# Patient Record
Sex: Female | Born: 1985 | Race: White | Marital: Single | Smoking: Former smoker
Health system: Southern US, Community
[De-identification: ages and names within clinical notes are randomized; demographics above are authoritative.]

## PROBLEM LIST (undated history)

## (undated) DIAGNOSIS — F331 Major depressive disorder, recurrent, moderate: Secondary | ICD-10-CM

## (undated) DIAGNOSIS — L853 Xerosis cutis: Secondary | ICD-10-CM

## (undated) DIAGNOSIS — I950 Idiopathic hypotension: Secondary | ICD-10-CM

## (undated) DIAGNOSIS — A6920 Lyme disease, unspecified: Secondary | ICD-10-CM

## (undated) DIAGNOSIS — K638219 Small intestinal bacterial overgrowth, unspecified: Secondary | ICD-10-CM

## (undated) DIAGNOSIS — N943 Premenstrual tension syndrome: Secondary | ICD-10-CM

## (undated) DIAGNOSIS — M4126 Other idiopathic scoliosis, lumbar region: Secondary | ICD-10-CM

## (undated) DIAGNOSIS — F99 Mental disorder, not otherwise specified: Secondary | ICD-10-CM

## (undated) DIAGNOSIS — G43009 Migraine without aura, not intractable, without status migrainosus: Secondary | ICD-10-CM

## (undated) DIAGNOSIS — K6389 Other specified diseases of intestine: Secondary | ICD-10-CM

## (undated) DIAGNOSIS — A6923 Arthritis due to Lyme disease: Secondary | ICD-10-CM

## (undated) DIAGNOSIS — Z91018 Allergy to other foods: Secondary | ICD-10-CM

## (undated) DIAGNOSIS — K219 Gastro-esophageal reflux disease without esophagitis: Secondary | ICD-10-CM

## (undated) DIAGNOSIS — Z8541 Personal history of malignant neoplasm of cervix uteri: Secondary | ICD-10-CM

## (undated) DIAGNOSIS — F5105 Insomnia due to other mental disorder: Secondary | ICD-10-CM

## (undated) HISTORY — DX: Migraine without aura, not intractable, without status migrainosus: G43.009

## (undated) HISTORY — PX: GANGLION CYST EXCISION: SHX1691

## (undated) HISTORY — DX: Idiopathic hypotension: I95.0

## (undated) HISTORY — DX: Allergy to other foods: Z91.018

## (undated) HISTORY — DX: Xerosis cutis: L85.3

## (undated) HISTORY — DX: Other idiopathic scoliosis, lumbar region: M41.26

## (undated) HISTORY — DX: Premenstrual tension syndrome: N94.3

## (undated) HISTORY — DX: Lyme disease, unspecified: A69.20

## (undated) HISTORY — DX: Insomnia due to other mental disorder: F51.05

## (undated) HISTORY — DX: Small intestinal bacterial overgrowth, unspecified: K63.8219

## (undated) HISTORY — DX: Gastro-esophageal reflux disease without esophagitis: K21.9

## (undated) HISTORY — DX: Arthritis due to Lyme disease: A69.23

## (undated) HISTORY — DX: Major depressive disorder, recurrent, moderate: F33.1

## (undated) HISTORY — DX: Personal history of malignant neoplasm of cervix uteri: Z85.41

## (undated) HISTORY — DX: Other specified diseases of intestine: K63.89

## (undated) HISTORY — DX: Insomnia due to other mental disorder: F99

---

## 2007-10-10 HISTORY — PX: TONSILLECTOMY: SUR1361

## 2009-08-18 HISTORY — PX: BREAST ENHANCEMENT SURGERY: SHX7

## 2011-09-20 HISTORY — PX: SINOSCOPY: SHX187

## 2015-05-31 ENCOUNTER — Other Ambulatory Visit: Payer: Self-pay | Admitting: Neurosurgery

## 2015-05-31 DIAGNOSIS — M5416 Radiculopathy, lumbar region: Secondary | ICD-10-CM

## 2015-06-08 ENCOUNTER — Ambulatory Visit
Admission: RE | Admit: 2015-06-08 | Discharge: 2015-06-08 | Disposition: A | Discharge status: Home / Self Care | Payer: BLUE CROSS/BLUE SHIELD | Source: Ambulatory Visit | Attending: Neurosurgery | Admitting: Neurosurgery

## 2015-06-08 DIAGNOSIS — M5416 Radiculopathy, lumbar region: Secondary | ICD-10-CM

## 2015-06-08 MED ORDER — IOHEXOL 180 MG/ML  SOLN
1.0000 mL | Freq: Once | INTRAMUSCULAR | Status: DC | PRN
  Administered 2015-06-08: 1 mL via EPIDURAL

## 2015-06-08 MED ORDER — IOHEXOL 180 MG/ML  SOLN: 1.0000 mL | Freq: Once | INTRAMUSCULAR | Status: DC | PRN

## 2015-06-08 MED ORDER — METHYLPREDNISOLONE ACETATE 40 MG/ML INJ SUSP (RADIOLOG
120.0000 mg | Freq: Once | INTRAMUSCULAR | Status: AC
  Administered 2015-06-08: 120 mg via EPIDURAL

## 2015-06-08 NOTE — Discharge Instructions (Signed)

## 2015-08-05 ENCOUNTER — Other Ambulatory Visit: Payer: Self-pay | Admitting: Neurosurgery

## 2015-08-05 DIAGNOSIS — M5416 Radiculopathy, lumbar region: Secondary | ICD-10-CM

## 2015-08-10 ENCOUNTER — Ambulatory Visit
Admission: RE | Admit: 2015-08-10 | Discharge: 2015-08-10 | Disposition: A | Discharge status: Home / Self Care | Payer: BLUE CROSS/BLUE SHIELD | Source: Ambulatory Visit | Attending: Neurosurgery | Admitting: Neurosurgery

## 2015-08-10 DIAGNOSIS — M5416 Radiculopathy, lumbar region: Secondary | ICD-10-CM

## 2015-08-10 IMAGING — XA DG EPIDUROGRAM S+I
1 series · 2 of 2 positions shown · non-contrast
Comparison: none

CLINICAL DATA: Lumbosacral spondylosis without myelopathy.

[Series 1: ortho standard · 2 of 2 slices shown]
[im 1/2]
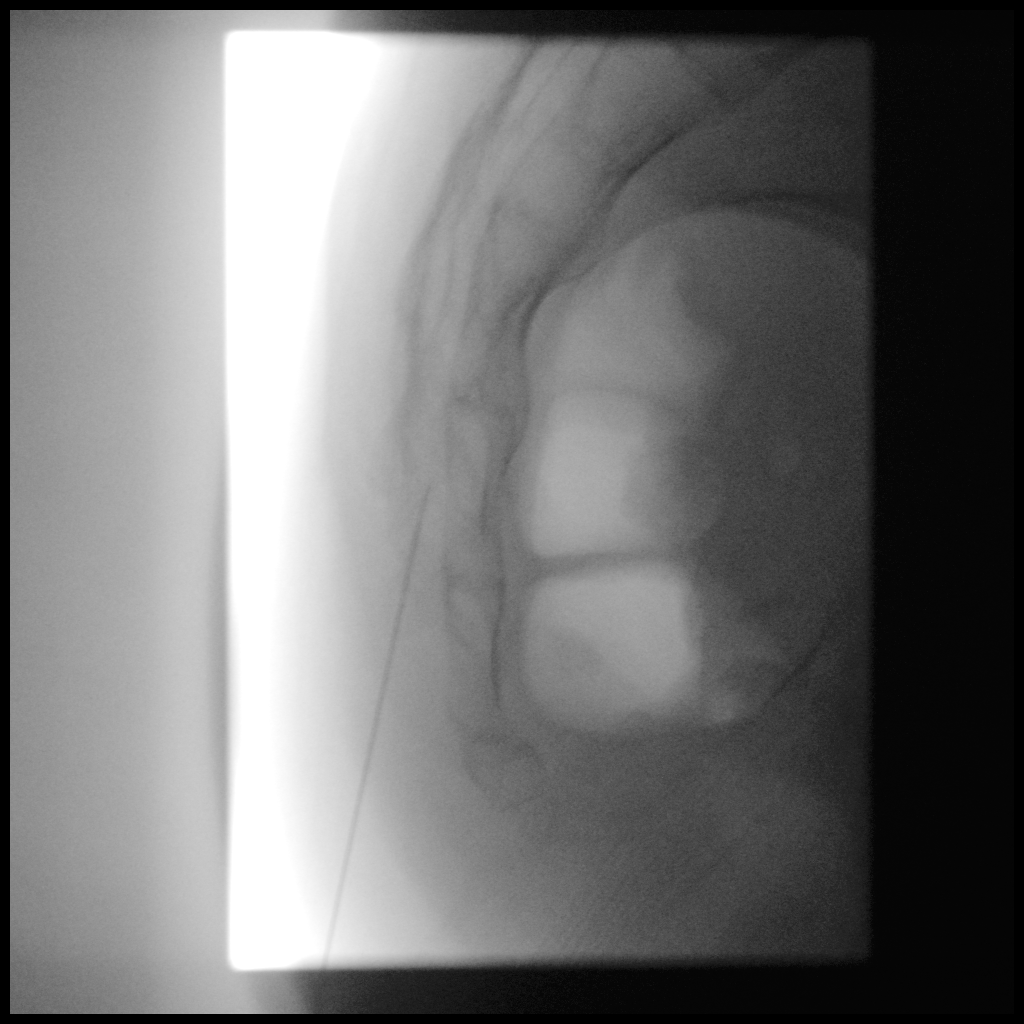
[im 2/2]
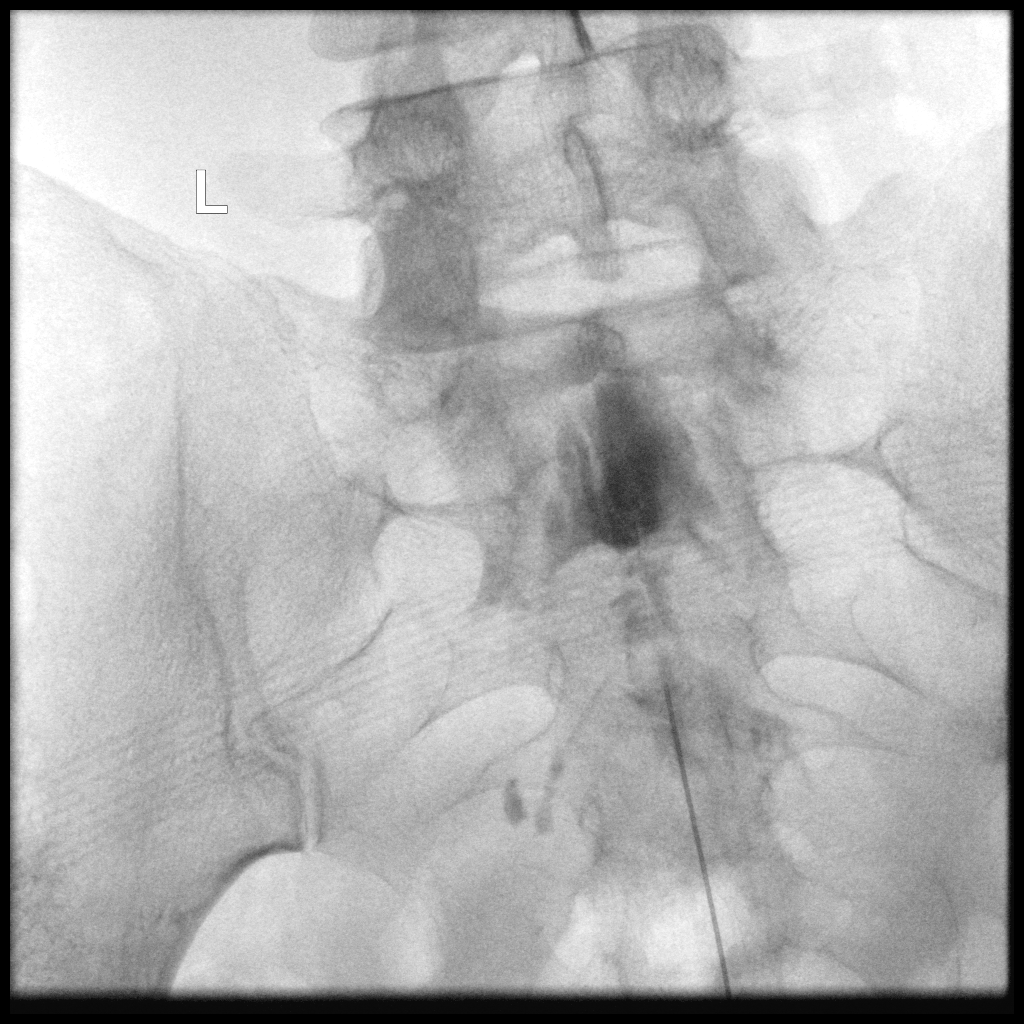

[2 of 2 positions shown; findings below may reference images not displayed]

FLUOROSCOPY TIME:  17 seconds corresponding to a Dose Area Product
of 24.55 ?Gy*m2

EXAM:
CAUDAL EPIDURAL INJECTION

Utilizing a caudal approach, the skin overlying the sacral hiatus
was cleansed and anesthetized. A 22 Crawford epidural needle was
advanced into the sacral epidural space. Injection of Omnipaque 180
shows a good epidural pattern with spread up to L5-S1. No vascular
opacification is seen.

120 mg of Depo-Medrol mixed with 3 ml of normal saline and 3 ml of
1% Lidocaine were instilled. The procedure was well-tolerated, and
the patient was discharged thirty minutes following the injection in
good condition.
IMPRESSION: Technically successful caudal epidural injection #2.

## 2015-08-10 MED ORDER — IOHEXOL 180 MG/ML  SOLN
1.0000 mL | Freq: Once | INTRAMUSCULAR | Status: DC | PRN
  Administered 2015-08-10: 1 mL via EPIDURAL

## 2015-08-10 MED ORDER — METHYLPREDNISOLONE ACETATE 40 MG/ML INJ SUSP (RADIOLOG
120.0000 mg | Freq: Once | INTRAMUSCULAR | Status: AC
  Administered 2015-08-10: 120 mg via EPIDURAL

## 2016-03-01 DIAGNOSIS — R197 Diarrhea, unspecified: Secondary | ICD-10-CM | POA: Diagnosis not present

## 2016-03-01 DIAGNOSIS — R109 Unspecified abdominal pain: Secondary | ICD-10-CM | POA: Diagnosis not present

## 2016-03-01 DIAGNOSIS — A049 Bacterial intestinal infection, unspecified: Secondary | ICD-10-CM | POA: Diagnosis not present

## 2016-03-01 DIAGNOSIS — R112 Nausea with vomiting, unspecified: Secondary | ICD-10-CM | POA: Diagnosis not present

## 2016-03-01 DIAGNOSIS — K529 Noninfective gastroenteritis and colitis, unspecified: Secondary | ICD-10-CM | POA: Diagnosis not present

## 2016-03-08 DIAGNOSIS — A6920 Lyme disease, unspecified: Secondary | ICD-10-CM | POA: Diagnosis not present

## 2016-03-08 DIAGNOSIS — R1110 Vomiting, unspecified: Secondary | ICD-10-CM | POA: Diagnosis not present

## 2016-03-08 DIAGNOSIS — E2749 Other adrenocortical insufficiency: Secondary | ICD-10-CM | POA: Diagnosis not present

## 2016-03-10 DIAGNOSIS — Z9049 Acquired absence of other specified parts of digestive tract: Secondary | ICD-10-CM | POA: Diagnosis not present

## 2016-03-10 DIAGNOSIS — R112 Nausea with vomiting, unspecified: Secondary | ICD-10-CM | POA: Diagnosis not present

## 2016-03-10 DIAGNOSIS — K529 Noninfective gastroenteritis and colitis, unspecified: Secondary | ICD-10-CM | POA: Diagnosis not present

## 2016-04-13 DIAGNOSIS — M545 Low back pain: Secondary | ICD-10-CM | POA: Diagnosis not present

## 2016-04-13 DIAGNOSIS — A6923 Arthritis due to Lyme disease: Secondary | ICD-10-CM | POA: Diagnosis not present

## 2016-04-13 DIAGNOSIS — M2550 Pain in unspecified joint: Secondary | ICD-10-CM | POA: Diagnosis not present

## 2016-05-11 DIAGNOSIS — L811 Chloasma: Secondary | ICD-10-CM | POA: Diagnosis not present

## 2016-05-16 DIAGNOSIS — R1310 Dysphagia, unspecified: Secondary | ICD-10-CM | POA: Diagnosis not present

## 2016-05-16 DIAGNOSIS — R1084 Generalized abdominal pain: Secondary | ICD-10-CM | POA: Diagnosis not present

## 2016-05-16 DIAGNOSIS — K6389 Other specified diseases of intestine: Secondary | ICD-10-CM | POA: Diagnosis not present

## 2016-05-25 DIAGNOSIS — K222 Esophageal obstruction: Secondary | ICD-10-CM | POA: Diagnosis not present

## 2016-05-25 DIAGNOSIS — R1310 Dysphagia, unspecified: Secondary | ICD-10-CM | POA: Diagnosis not present

## 2016-06-13 DIAGNOSIS — E2749 Other adrenocortical insufficiency: Secondary | ICD-10-CM | POA: Diagnosis not present

## 2016-06-13 DIAGNOSIS — A6920 Lyme disease, unspecified: Secondary | ICD-10-CM | POA: Diagnosis not present

## 2016-07-17 DIAGNOSIS — Z23 Encounter for immunization: Secondary | ICD-10-CM | POA: Diagnosis not present

## 2016-07-17 DIAGNOSIS — M5489 Other dorsalgia: Secondary | ICD-10-CM | POA: Diagnosis not present

## 2016-07-17 DIAGNOSIS — F321 Major depressive disorder, single episode, moderate: Secondary | ICD-10-CM | POA: Diagnosis not present

## 2016-07-17 DIAGNOSIS — A6923 Arthritis due to Lyme disease: Secondary | ICD-10-CM | POA: Diagnosis not present

## 2016-07-17 DIAGNOSIS — F5101 Primary insomnia: Secondary | ICD-10-CM | POA: Diagnosis not present

## 2016-07-25 DIAGNOSIS — M545 Low back pain: Secondary | ICD-10-CM | POA: Diagnosis not present

## 2016-07-25 DIAGNOSIS — R2689 Other abnormalities of gait and mobility: Secondary | ICD-10-CM | POA: Diagnosis not present

## 2016-07-25 DIAGNOSIS — M546 Pain in thoracic spine: Secondary | ICD-10-CM | POA: Diagnosis not present

## 2016-07-25 DIAGNOSIS — M6281 Muscle weakness (generalized): Secondary | ICD-10-CM | POA: Diagnosis not present

## 2016-07-28 DIAGNOSIS — M6281 Muscle weakness (generalized): Secondary | ICD-10-CM | POA: Diagnosis not present

## 2016-07-28 DIAGNOSIS — M546 Pain in thoracic spine: Secondary | ICD-10-CM | POA: Diagnosis not present

## 2016-07-28 DIAGNOSIS — R2689 Other abnormalities of gait and mobility: Secondary | ICD-10-CM | POA: Diagnosis not present

## 2016-07-28 DIAGNOSIS — M545 Low back pain: Secondary | ICD-10-CM | POA: Diagnosis not present

## 2016-08-01 DIAGNOSIS — R2689 Other abnormalities of gait and mobility: Secondary | ICD-10-CM | POA: Diagnosis not present

## 2016-08-01 DIAGNOSIS — M545 Low back pain: Secondary | ICD-10-CM | POA: Diagnosis not present

## 2016-08-01 DIAGNOSIS — M546 Pain in thoracic spine: Secondary | ICD-10-CM | POA: Diagnosis not present

## 2016-08-01 DIAGNOSIS — M6281 Muscle weakness (generalized): Secondary | ICD-10-CM | POA: Diagnosis not present

## 2016-08-03 DIAGNOSIS — R2689 Other abnormalities of gait and mobility: Secondary | ICD-10-CM | POA: Diagnosis not present

## 2016-08-03 DIAGNOSIS — M546 Pain in thoracic spine: Secondary | ICD-10-CM | POA: Diagnosis not present

## 2016-08-03 DIAGNOSIS — M545 Low back pain: Secondary | ICD-10-CM | POA: Diagnosis not present

## 2016-08-03 DIAGNOSIS — M6281 Muscle weakness (generalized): Secondary | ICD-10-CM | POA: Diagnosis not present

## 2016-08-04 DIAGNOSIS — K6389 Other specified diseases of intestine: Secondary | ICD-10-CM | POA: Diagnosis not present

## 2016-08-04 DIAGNOSIS — K5909 Other constipation: Secondary | ICD-10-CM | POA: Diagnosis not present

## 2016-08-04 DIAGNOSIS — R198 Other specified symptoms and signs involving the digestive system and abdomen: Secondary | ICD-10-CM | POA: Diagnosis not present

## 2016-08-08 DIAGNOSIS — M546 Pain in thoracic spine: Secondary | ICD-10-CM | POA: Diagnosis not present

## 2016-08-08 DIAGNOSIS — M545 Low back pain: Secondary | ICD-10-CM | POA: Diagnosis not present

## 2016-08-08 DIAGNOSIS — R2689 Other abnormalities of gait and mobility: Secondary | ICD-10-CM | POA: Diagnosis not present

## 2016-08-08 DIAGNOSIS — M6281 Muscle weakness (generalized): Secondary | ICD-10-CM | POA: Diagnosis not present

## 2016-08-10 DIAGNOSIS — R2689 Other abnormalities of gait and mobility: Secondary | ICD-10-CM | POA: Diagnosis not present

## 2016-08-10 DIAGNOSIS — M6281 Muscle weakness (generalized): Secondary | ICD-10-CM | POA: Diagnosis not present

## 2016-08-10 DIAGNOSIS — M545 Low back pain: Secondary | ICD-10-CM | POA: Diagnosis not present

## 2016-08-10 DIAGNOSIS — M546 Pain in thoracic spine: Secondary | ICD-10-CM | POA: Diagnosis not present

## 2016-08-21 DIAGNOSIS — J209 Acute bronchitis, unspecified: Secondary | ICD-10-CM | POA: Diagnosis not present

## 2016-08-21 DIAGNOSIS — J208 Acute bronchitis due to other specified organisms: Secondary | ICD-10-CM | POA: Diagnosis not present

## 2016-08-23 DIAGNOSIS — F321 Major depressive disorder, single episode, moderate: Secondary | ICD-10-CM | POA: Diagnosis not present

## 2016-08-23 DIAGNOSIS — M5489 Other dorsalgia: Secondary | ICD-10-CM | POA: Diagnosis not present

## 2016-08-23 DIAGNOSIS — F5101 Primary insomnia: Secondary | ICD-10-CM | POA: Diagnosis not present

## 2016-08-23 DIAGNOSIS — A6923 Arthritis due to Lyme disease: Secondary | ICD-10-CM | POA: Diagnosis not present

## 2016-09-07 DIAGNOSIS — R112 Nausea with vomiting, unspecified: Secondary | ICD-10-CM | POA: Diagnosis not present

## 2016-09-07 DIAGNOSIS — R197 Diarrhea, unspecified: Secondary | ICD-10-CM | POA: Diagnosis not present

## 2016-09-07 DIAGNOSIS — R1010 Upper abdominal pain, unspecified: Secondary | ICD-10-CM | POA: Diagnosis not present

## 2016-09-08 DIAGNOSIS — K529 Noninfective gastroenteritis and colitis, unspecified: Secondary | ICD-10-CM | POA: Diagnosis not present

## 2016-09-12 DIAGNOSIS — M62838 Other muscle spasm: Secondary | ICD-10-CM | POA: Diagnosis not present

## 2016-09-12 DIAGNOSIS — A6920 Lyme disease, unspecified: Secondary | ICD-10-CM | POA: Diagnosis not present

## 2016-09-12 DIAGNOSIS — M999 Biomechanical lesion, unspecified: Secondary | ICD-10-CM | POA: Diagnosis not present

## 2016-09-14 DIAGNOSIS — R109 Unspecified abdominal pain: Secondary | ICD-10-CM | POA: Diagnosis not present

## 2016-09-14 DIAGNOSIS — R1110 Vomiting, unspecified: Secondary | ICD-10-CM | POA: Diagnosis not present

## 2016-09-14 DIAGNOSIS — R1010 Upper abdominal pain, unspecified: Secondary | ICD-10-CM | POA: Diagnosis not present

## 2016-09-14 DIAGNOSIS — R197 Diarrhea, unspecified: Secondary | ICD-10-CM | POA: Diagnosis not present

## 2016-09-18 DIAGNOSIS — H04123 Dry eye syndrome of bilateral lacrimal glands: Secondary | ICD-10-CM | POA: Diagnosis not present

## 2016-09-27 DIAGNOSIS — R1010 Upper abdominal pain, unspecified: Secondary | ICD-10-CM | POA: Diagnosis not present

## 2016-09-27 DIAGNOSIS — R112 Nausea with vomiting, unspecified: Secondary | ICD-10-CM | POA: Diagnosis not present

## 2016-09-29 DIAGNOSIS — R109 Unspecified abdominal pain: Secondary | ICD-10-CM | POA: Diagnosis not present

## 2016-09-29 DIAGNOSIS — R1011 Right upper quadrant pain: Secondary | ICD-10-CM | POA: Diagnosis not present

## 2016-10-09 HISTORY — PX: CHOLECYSTECTOMY: SHX55

## 2016-10-16 DIAGNOSIS — M546 Pain in thoracic spine: Secondary | ICD-10-CM | POA: Diagnosis not present

## 2016-10-16 DIAGNOSIS — M6281 Muscle weakness (generalized): Secondary | ICD-10-CM | POA: Diagnosis not present

## 2016-10-16 DIAGNOSIS — R2689 Other abnormalities of gait and mobility: Secondary | ICD-10-CM | POA: Diagnosis not present

## 2016-10-16 DIAGNOSIS — M545 Low back pain: Secondary | ICD-10-CM | POA: Diagnosis not present

## 2016-10-25 DIAGNOSIS — R112 Nausea with vomiting, unspecified: Secondary | ICD-10-CM | POA: Diagnosis not present

## 2016-10-25 DIAGNOSIS — R197 Diarrhea, unspecified: Secondary | ICD-10-CM | POA: Diagnosis not present

## 2016-10-25 DIAGNOSIS — R1084 Generalized abdominal pain: Secondary | ICD-10-CM | POA: Diagnosis not present

## 2016-10-25 DIAGNOSIS — R109 Unspecified abdominal pain: Secondary | ICD-10-CM | POA: Diagnosis not present

## 2016-10-30 DIAGNOSIS — R29898 Other symptoms and signs involving the musculoskeletal system: Secondary | ICD-10-CM | POA: Diagnosis not present

## 2016-10-30 DIAGNOSIS — M25561 Pain in right knee: Secondary | ICD-10-CM | POA: Diagnosis not present

## 2016-10-31 DIAGNOSIS — Z01419 Encounter for gynecological examination (general) (routine) without abnormal findings: Secondary | ICD-10-CM | POA: Diagnosis not present

## 2016-11-02 DIAGNOSIS — J018 Other acute sinusitis: Secondary | ICD-10-CM | POA: Diagnosis not present

## 2016-11-15 DIAGNOSIS — R112 Nausea with vomiting, unspecified: Secondary | ICD-10-CM | POA: Diagnosis not present

## 2016-11-15 DIAGNOSIS — R197 Diarrhea, unspecified: Secondary | ICD-10-CM | POA: Diagnosis not present

## 2016-11-15 DIAGNOSIS — R1013 Epigastric pain: Secondary | ICD-10-CM | POA: Diagnosis not present

## 2016-11-20 DIAGNOSIS — M545 Low back pain: Secondary | ICD-10-CM | POA: Diagnosis not present

## 2016-12-04 DIAGNOSIS — M545 Low back pain: Secondary | ICD-10-CM | POA: Diagnosis not present

## 2016-12-04 DIAGNOSIS — R2689 Other abnormalities of gait and mobility: Secondary | ICD-10-CM | POA: Diagnosis not present

## 2016-12-04 DIAGNOSIS — M6281 Muscle weakness (generalized): Secondary | ICD-10-CM | POA: Diagnosis not present

## 2016-12-04 DIAGNOSIS — M546 Pain in thoracic spine: Secondary | ICD-10-CM | POA: Diagnosis not present

## 2016-12-06 DIAGNOSIS — J018 Other acute sinusitis: Secondary | ICD-10-CM | POA: Diagnosis not present

## 2016-12-11 DIAGNOSIS — M6281 Muscle weakness (generalized): Secondary | ICD-10-CM | POA: Diagnosis not present

## 2016-12-11 DIAGNOSIS — R2689 Other abnormalities of gait and mobility: Secondary | ICD-10-CM | POA: Diagnosis not present

## 2016-12-11 DIAGNOSIS — M545 Low back pain: Secondary | ICD-10-CM | POA: Diagnosis not present

## 2016-12-11 DIAGNOSIS — M546 Pain in thoracic spine: Secondary | ICD-10-CM | POA: Diagnosis not present

## 2016-12-15 DIAGNOSIS — R2689 Other abnormalities of gait and mobility: Secondary | ICD-10-CM | POA: Diagnosis not present

## 2016-12-15 DIAGNOSIS — M546 Pain in thoracic spine: Secondary | ICD-10-CM | POA: Diagnosis not present

## 2016-12-15 DIAGNOSIS — M545 Low back pain: Secondary | ICD-10-CM | POA: Diagnosis not present

## 2016-12-15 DIAGNOSIS — M6281 Muscle weakness (generalized): Secondary | ICD-10-CM | POA: Diagnosis not present

## 2016-12-20 DIAGNOSIS — F321 Major depressive disorder, single episode, moderate: Secondary | ICD-10-CM | POA: Diagnosis not present

## 2016-12-20 DIAGNOSIS — R2689 Other abnormalities of gait and mobility: Secondary | ICD-10-CM | POA: Diagnosis not present

## 2016-12-20 DIAGNOSIS — F5101 Primary insomnia: Secondary | ICD-10-CM | POA: Diagnosis not present

## 2016-12-20 DIAGNOSIS — A6923 Arthritis due to Lyme disease: Secondary | ICD-10-CM | POA: Diagnosis not present

## 2016-12-20 DIAGNOSIS — M5489 Other dorsalgia: Secondary | ICD-10-CM | POA: Diagnosis not present

## 2016-12-20 DIAGNOSIS — M6281 Muscle weakness (generalized): Secondary | ICD-10-CM | POA: Diagnosis not present

## 2016-12-20 DIAGNOSIS — M546 Pain in thoracic spine: Secondary | ICD-10-CM | POA: Diagnosis not present

## 2016-12-20 DIAGNOSIS — M545 Low back pain: Secondary | ICD-10-CM | POA: Diagnosis not present

## 2016-12-21 DIAGNOSIS — M545 Low back pain: Secondary | ICD-10-CM | POA: Diagnosis not present

## 2016-12-21 DIAGNOSIS — R2689 Other abnormalities of gait and mobility: Secondary | ICD-10-CM | POA: Diagnosis not present

## 2016-12-21 DIAGNOSIS — M6281 Muscle weakness (generalized): Secondary | ICD-10-CM | POA: Diagnosis not present

## 2016-12-21 DIAGNOSIS — M546 Pain in thoracic spine: Secondary | ICD-10-CM | POA: Diagnosis not present

## 2016-12-25 DIAGNOSIS — R2689 Other abnormalities of gait and mobility: Secondary | ICD-10-CM | POA: Diagnosis not present

## 2016-12-25 DIAGNOSIS — M6281 Muscle weakness (generalized): Secondary | ICD-10-CM | POA: Diagnosis not present

## 2016-12-25 DIAGNOSIS — M546 Pain in thoracic spine: Secondary | ICD-10-CM | POA: Diagnosis not present

## 2016-12-25 DIAGNOSIS — M545 Low back pain: Secondary | ICD-10-CM | POA: Diagnosis not present

## 2016-12-26 DIAGNOSIS — J018 Other acute sinusitis: Secondary | ICD-10-CM | POA: Diagnosis not present

## 2016-12-28 DIAGNOSIS — K5900 Constipation, unspecified: Secondary | ICD-10-CM | POA: Diagnosis not present

## 2017-01-03 DIAGNOSIS — M545 Low back pain: Secondary | ICD-10-CM | POA: Diagnosis not present

## 2017-01-03 DIAGNOSIS — M546 Pain in thoracic spine: Secondary | ICD-10-CM | POA: Diagnosis not present

## 2017-01-03 DIAGNOSIS — M6281 Muscle weakness (generalized): Secondary | ICD-10-CM | POA: Diagnosis not present

## 2017-01-03 DIAGNOSIS — R2689 Other abnormalities of gait and mobility: Secondary | ICD-10-CM | POA: Diagnosis not present

## 2017-01-08 DIAGNOSIS — M546 Pain in thoracic spine: Secondary | ICD-10-CM | POA: Diagnosis not present

## 2017-01-08 DIAGNOSIS — M6281 Muscle weakness (generalized): Secondary | ICD-10-CM | POA: Diagnosis not present

## 2017-01-08 DIAGNOSIS — R2689 Other abnormalities of gait and mobility: Secondary | ICD-10-CM | POA: Diagnosis not present

## 2017-01-08 DIAGNOSIS — M545 Low back pain: Secondary | ICD-10-CM | POA: Diagnosis not present

## 2017-01-10 DIAGNOSIS — M546 Pain in thoracic spine: Secondary | ICD-10-CM | POA: Diagnosis not present

## 2017-01-10 DIAGNOSIS — M545 Low back pain: Secondary | ICD-10-CM | POA: Diagnosis not present

## 2017-01-10 DIAGNOSIS — R2689 Other abnormalities of gait and mobility: Secondary | ICD-10-CM | POA: Diagnosis not present

## 2017-01-10 DIAGNOSIS — M6281 Muscle weakness (generalized): Secondary | ICD-10-CM | POA: Diagnosis not present

## 2017-01-15 DIAGNOSIS — R2689 Other abnormalities of gait and mobility: Secondary | ICD-10-CM | POA: Diagnosis not present

## 2017-01-15 DIAGNOSIS — M6281 Muscle weakness (generalized): Secondary | ICD-10-CM | POA: Diagnosis not present

## 2017-01-15 DIAGNOSIS — M545 Low back pain: Secondary | ICD-10-CM | POA: Diagnosis not present

## 2017-01-15 DIAGNOSIS — M546 Pain in thoracic spine: Secondary | ICD-10-CM | POA: Diagnosis not present

## 2017-01-17 DIAGNOSIS — M545 Low back pain: Secondary | ICD-10-CM | POA: Diagnosis not present

## 2017-01-17 DIAGNOSIS — M546 Pain in thoracic spine: Secondary | ICD-10-CM | POA: Diagnosis not present

## 2017-01-17 DIAGNOSIS — R2689 Other abnormalities of gait and mobility: Secondary | ICD-10-CM | POA: Diagnosis not present

## 2017-01-17 DIAGNOSIS — M6281 Muscle weakness (generalized): Secondary | ICD-10-CM | POA: Diagnosis not present

## 2017-01-22 DIAGNOSIS — M545 Low back pain: Secondary | ICD-10-CM | POA: Diagnosis not present

## 2017-01-22 DIAGNOSIS — M6281 Muscle weakness (generalized): Secondary | ICD-10-CM | POA: Diagnosis not present

## 2017-01-22 DIAGNOSIS — R2689 Other abnormalities of gait and mobility: Secondary | ICD-10-CM | POA: Diagnosis not present

## 2017-01-22 DIAGNOSIS — M546 Pain in thoracic spine: Secondary | ICD-10-CM | POA: Diagnosis not present

## 2017-01-26 DIAGNOSIS — F331 Major depressive disorder, recurrent, moderate: Secondary | ICD-10-CM | POA: Diagnosis not present

## 2017-01-26 DIAGNOSIS — M791 Myalgia: Secondary | ICD-10-CM | POA: Diagnosis not present

## 2017-01-29 DIAGNOSIS — M546 Pain in thoracic spine: Secondary | ICD-10-CM | POA: Diagnosis not present

## 2017-01-29 DIAGNOSIS — R2689 Other abnormalities of gait and mobility: Secondary | ICD-10-CM | POA: Diagnosis not present

## 2017-01-29 DIAGNOSIS — M545 Low back pain: Secondary | ICD-10-CM | POA: Diagnosis not present

## 2017-01-29 DIAGNOSIS — M6281 Muscle weakness (generalized): Secondary | ICD-10-CM | POA: Diagnosis not present

## 2017-01-30 DIAGNOSIS — I950 Idiopathic hypotension: Secondary | ICD-10-CM | POA: Diagnosis not present

## 2017-01-30 DIAGNOSIS — R5383 Other fatigue: Secondary | ICD-10-CM | POA: Diagnosis not present

## 2017-01-30 DIAGNOSIS — M546 Pain in thoracic spine: Secondary | ICD-10-CM | POA: Diagnosis not present

## 2017-01-31 DIAGNOSIS — M546 Pain in thoracic spine: Secondary | ICD-10-CM | POA: Diagnosis not present

## 2017-01-31 DIAGNOSIS — M6281 Muscle weakness (generalized): Secondary | ICD-10-CM | POA: Diagnosis not present

## 2017-01-31 DIAGNOSIS — M545 Low back pain: Secondary | ICD-10-CM | POA: Diagnosis not present

## 2017-01-31 DIAGNOSIS — R2689 Other abnormalities of gait and mobility: Secondary | ICD-10-CM | POA: Diagnosis not present

## 2017-02-05 DIAGNOSIS — M545 Low back pain: Secondary | ICD-10-CM | POA: Diagnosis not present

## 2017-02-05 DIAGNOSIS — M546 Pain in thoracic spine: Secondary | ICD-10-CM | POA: Diagnosis not present

## 2017-02-05 DIAGNOSIS — R2689 Other abnormalities of gait and mobility: Secondary | ICD-10-CM | POA: Diagnosis not present

## 2017-02-05 DIAGNOSIS — M6281 Muscle weakness (generalized): Secondary | ICD-10-CM | POA: Diagnosis not present

## 2017-02-07 DIAGNOSIS — M25552 Pain in left hip: Secondary | ICD-10-CM | POA: Diagnosis not present

## 2017-02-07 DIAGNOSIS — M25551 Pain in right hip: Secondary | ICD-10-CM | POA: Diagnosis not present

## 2017-02-07 DIAGNOSIS — F320 Major depressive disorder, single episode, mild: Secondary | ICD-10-CM | POA: Diagnosis not present

## 2017-02-07 DIAGNOSIS — M545 Low back pain: Secondary | ICD-10-CM | POA: Diagnosis not present

## 2017-02-14 DIAGNOSIS — M546 Pain in thoracic spine: Secondary | ICD-10-CM | POA: Diagnosis not present

## 2017-02-14 DIAGNOSIS — M545 Low back pain: Secondary | ICD-10-CM | POA: Diagnosis not present

## 2017-02-14 DIAGNOSIS — M6281 Muscle weakness (generalized): Secondary | ICD-10-CM | POA: Diagnosis not present

## 2017-02-14 DIAGNOSIS — R2689 Other abnormalities of gait and mobility: Secondary | ICD-10-CM | POA: Diagnosis not present

## 2017-02-19 DIAGNOSIS — M6281 Muscle weakness (generalized): Secondary | ICD-10-CM | POA: Diagnosis not present

## 2017-02-19 DIAGNOSIS — M546 Pain in thoracic spine: Secondary | ICD-10-CM | POA: Diagnosis not present

## 2017-02-19 DIAGNOSIS — R2689 Other abnormalities of gait and mobility: Secondary | ICD-10-CM | POA: Diagnosis not present

## 2017-02-19 DIAGNOSIS — M545 Low back pain: Secondary | ICD-10-CM | POA: Diagnosis not present

## 2017-03-12 DIAGNOSIS — M546 Pain in thoracic spine: Secondary | ICD-10-CM | POA: Diagnosis not present

## 2017-03-12 DIAGNOSIS — M6281 Muscle weakness (generalized): Secondary | ICD-10-CM | POA: Diagnosis not present

## 2017-03-12 DIAGNOSIS — R2689 Other abnormalities of gait and mobility: Secondary | ICD-10-CM | POA: Diagnosis not present

## 2017-03-12 DIAGNOSIS — M545 Low back pain: Secondary | ICD-10-CM | POA: Diagnosis not present

## 2017-03-14 DIAGNOSIS — F320 Major depressive disorder, single episode, mild: Secondary | ICD-10-CM | POA: Diagnosis not present

## 2017-03-14 DIAGNOSIS — M25561 Pain in right knee: Secondary | ICD-10-CM | POA: Diagnosis not present

## 2017-03-14 DIAGNOSIS — M546 Pain in thoracic spine: Secondary | ICD-10-CM | POA: Diagnosis not present

## 2017-03-16 DIAGNOSIS — M67451 Ganglion, right hip: Secondary | ICD-10-CM | POA: Diagnosis not present

## 2017-03-16 DIAGNOSIS — M222X1 Patellofemoral disorders, right knee: Secondary | ICD-10-CM | POA: Diagnosis not present

## 2017-03-16 DIAGNOSIS — M25561 Pain in right knee: Secondary | ICD-10-CM | POA: Diagnosis not present

## 2017-03-22 DIAGNOSIS — N760 Acute vaginitis: Secondary | ICD-10-CM | POA: Diagnosis not present

## 2017-03-22 DIAGNOSIS — F17200 Nicotine dependence, unspecified, uncomplicated: Secondary | ICD-10-CM | POA: Diagnosis not present

## 2017-03-28 DIAGNOSIS — M546 Pain in thoracic spine: Secondary | ICD-10-CM | POA: Diagnosis not present

## 2017-03-28 DIAGNOSIS — M6281 Muscle weakness (generalized): Secondary | ICD-10-CM | POA: Diagnosis not present

## 2017-03-28 DIAGNOSIS — R2689 Other abnormalities of gait and mobility: Secondary | ICD-10-CM | POA: Diagnosis not present

## 2017-03-28 DIAGNOSIS — M545 Low back pain: Secondary | ICD-10-CM | POA: Diagnosis not present

## 2017-04-02 DIAGNOSIS — R2689 Other abnormalities of gait and mobility: Secondary | ICD-10-CM | POA: Diagnosis not present

## 2017-04-02 DIAGNOSIS — M545 Low back pain: Secondary | ICD-10-CM | POA: Diagnosis not present

## 2017-04-02 DIAGNOSIS — M546 Pain in thoracic spine: Secondary | ICD-10-CM | POA: Diagnosis not present

## 2017-04-02 DIAGNOSIS — M6281 Muscle weakness (generalized): Secondary | ICD-10-CM | POA: Diagnosis not present

## 2017-04-13 DIAGNOSIS — M545 Low back pain: Secondary | ICD-10-CM | POA: Diagnosis not present

## 2017-04-13 DIAGNOSIS — M546 Pain in thoracic spine: Secondary | ICD-10-CM | POA: Diagnosis not present

## 2017-04-13 DIAGNOSIS — R2689 Other abnormalities of gait and mobility: Secondary | ICD-10-CM | POA: Diagnosis not present

## 2017-04-13 DIAGNOSIS — M6281 Muscle weakness (generalized): Secondary | ICD-10-CM | POA: Diagnosis not present

## 2017-04-18 DIAGNOSIS — R2689 Other abnormalities of gait and mobility: Secondary | ICD-10-CM | POA: Diagnosis not present

## 2017-04-18 DIAGNOSIS — M25561 Pain in right knee: Secondary | ICD-10-CM | POA: Diagnosis not present

## 2017-04-18 DIAGNOSIS — M6281 Muscle weakness (generalized): Secondary | ICD-10-CM | POA: Diagnosis not present

## 2017-04-18 DIAGNOSIS — M222X1 Patellofemoral disorders, right knee: Secondary | ICD-10-CM | POA: Diagnosis not present

## 2017-04-20 DIAGNOSIS — M25561 Pain in right knee: Secondary | ICD-10-CM | POA: Diagnosis not present

## 2017-04-25 DIAGNOSIS — N760 Acute vaginitis: Secondary | ICD-10-CM | POA: Diagnosis not present

## 2017-05-07 DIAGNOSIS — N760 Acute vaginitis: Secondary | ICD-10-CM | POA: Diagnosis not present

## 2017-05-07 DIAGNOSIS — H6121 Impacted cerumen, right ear: Secondary | ICD-10-CM | POA: Diagnosis not present

## 2017-05-21 DIAGNOSIS — R2689 Other abnormalities of gait and mobility: Secondary | ICD-10-CM | POA: Diagnosis not present

## 2017-05-21 DIAGNOSIS — M545 Low back pain: Secondary | ICD-10-CM | POA: Diagnosis not present

## 2017-05-21 DIAGNOSIS — M546 Pain in thoracic spine: Secondary | ICD-10-CM | POA: Diagnosis not present

## 2017-05-21 DIAGNOSIS — M6281 Muscle weakness (generalized): Secondary | ICD-10-CM | POA: Diagnosis not present

## 2017-05-29 DIAGNOSIS — R197 Diarrhea, unspecified: Secondary | ICD-10-CM | POA: Diagnosis not present

## 2017-05-29 DIAGNOSIS — K602 Anal fissure, unspecified: Secondary | ICD-10-CM | POA: Diagnosis not present

## 2017-06-04 DIAGNOSIS — M6281 Muscle weakness (generalized): Secondary | ICD-10-CM | POA: Diagnosis not present

## 2017-06-04 DIAGNOSIS — M545 Low back pain: Secondary | ICD-10-CM | POA: Diagnosis not present

## 2017-06-04 DIAGNOSIS — M546 Pain in thoracic spine: Secondary | ICD-10-CM | POA: Diagnosis not present

## 2017-06-04 DIAGNOSIS — R2689 Other abnormalities of gait and mobility: Secondary | ICD-10-CM | POA: Diagnosis not present

## 2017-06-15 DIAGNOSIS — J018 Other acute sinusitis: Secondary | ICD-10-CM | POA: Diagnosis not present

## 2017-06-15 DIAGNOSIS — F320 Major depressive disorder, single episode, mild: Secondary | ICD-10-CM | POA: Diagnosis not present

## 2017-06-25 DIAGNOSIS — R2689 Other abnormalities of gait and mobility: Secondary | ICD-10-CM | POA: Diagnosis not present

## 2017-06-25 DIAGNOSIS — M546 Pain in thoracic spine: Secondary | ICD-10-CM | POA: Diagnosis not present

## 2017-06-25 DIAGNOSIS — M545 Low back pain: Secondary | ICD-10-CM | POA: Diagnosis not present

## 2017-06-25 DIAGNOSIS — M6281 Muscle weakness (generalized): Secondary | ICD-10-CM | POA: Diagnosis not present

## 2017-07-06 DIAGNOSIS — R05 Cough: Secondary | ICD-10-CM | POA: Diagnosis not present

## 2017-07-06 DIAGNOSIS — J208 Acute bronchitis due to other specified organisms: Secondary | ICD-10-CM | POA: Diagnosis not present

## 2017-07-09 DIAGNOSIS — M546 Pain in thoracic spine: Secondary | ICD-10-CM | POA: Diagnosis not present

## 2017-07-09 DIAGNOSIS — M6281 Muscle weakness (generalized): Secondary | ICD-10-CM | POA: Diagnosis not present

## 2017-07-09 DIAGNOSIS — M545 Low back pain: Secondary | ICD-10-CM | POA: Diagnosis not present

## 2017-07-09 DIAGNOSIS — R2689 Other abnormalities of gait and mobility: Secondary | ICD-10-CM | POA: Diagnosis not present

## 2017-07-13 DIAGNOSIS — M542 Cervicalgia: Secondary | ICD-10-CM | POA: Diagnosis not present

## 2017-07-13 DIAGNOSIS — M9901 Segmental and somatic dysfunction of cervical region: Secondary | ICD-10-CM | POA: Diagnosis not present

## 2017-07-13 DIAGNOSIS — M545 Low back pain: Secondary | ICD-10-CM | POA: Diagnosis not present

## 2017-07-13 DIAGNOSIS — M9903 Segmental and somatic dysfunction of lumbar region: Secondary | ICD-10-CM | POA: Diagnosis not present

## 2017-07-16 DIAGNOSIS — M9901 Segmental and somatic dysfunction of cervical region: Secondary | ICD-10-CM | POA: Diagnosis not present

## 2017-07-16 DIAGNOSIS — M545 Low back pain: Secondary | ICD-10-CM | POA: Diagnosis not present

## 2017-07-16 DIAGNOSIS — M9903 Segmental and somatic dysfunction of lumbar region: Secondary | ICD-10-CM | POA: Diagnosis not present

## 2017-07-16 DIAGNOSIS — M542 Cervicalgia: Secondary | ICD-10-CM | POA: Diagnosis not present

## 2017-07-19 DIAGNOSIS — M25511 Pain in right shoulder: Secondary | ICD-10-CM | POA: Diagnosis not present

## 2017-07-19 DIAGNOSIS — M25551 Pain in right hip: Secondary | ICD-10-CM | POA: Diagnosis not present

## 2017-07-19 DIAGNOSIS — M790 Rheumatism, unspecified: Secondary | ICD-10-CM | POA: Diagnosis not present

## 2017-07-19 DIAGNOSIS — M7918 Myalgia, other site: Secondary | ICD-10-CM | POA: Diagnosis not present

## 2017-08-16 DIAGNOSIS — R590 Localized enlarged lymph nodes: Secondary | ICD-10-CM | POA: Diagnosis not present

## 2017-08-20 DIAGNOSIS — R058 Other specified cough: Secondary | ICD-10-CM | POA: Insufficient documentation

## 2017-08-20 DIAGNOSIS — M62830 Muscle spasm of back: Secondary | ICD-10-CM | POA: Insufficient documentation

## 2017-08-20 DIAGNOSIS — F330 Major depressive disorder, recurrent, mild: Secondary | ICD-10-CM | POA: Insufficient documentation

## 2017-08-20 DIAGNOSIS — E782 Mixed hyperlipidemia: Secondary | ICD-10-CM | POA: Insufficient documentation

## 2017-08-20 DIAGNOSIS — G44229 Chronic tension-type headache, not intractable: Secondary | ICD-10-CM | POA: Insufficient documentation

## 2017-08-20 DIAGNOSIS — M542 Cervicalgia: Secondary | ICD-10-CM | POA: Insufficient documentation

## 2017-08-20 DIAGNOSIS — M62838 Other muscle spasm: Secondary | ICD-10-CM | POA: Insufficient documentation

## 2017-08-20 DIAGNOSIS — F17200 Nicotine dependence, unspecified, uncomplicated: Secondary | ICD-10-CM | POA: Insufficient documentation

## 2017-08-20 DIAGNOSIS — K649 Unspecified hemorrhoids: Secondary | ICD-10-CM | POA: Insufficient documentation

## 2017-08-20 DIAGNOSIS — R58 Hemorrhage, not elsewhere classified: Secondary | ICD-10-CM | POA: Insufficient documentation

## 2017-08-20 DIAGNOSIS — L0231 Cutaneous abscess of buttock: Secondary | ICD-10-CM | POA: Insufficient documentation

## 2017-08-20 DIAGNOSIS — B070 Plantar wart: Secondary | ICD-10-CM | POA: Insufficient documentation

## 2017-08-20 DIAGNOSIS — B2709 Gammaherpesviral mononucleosis with other complications: Secondary | ICD-10-CM | POA: Insufficient documentation

## 2017-08-20 DIAGNOSIS — R059 Cough, unspecified: Secondary | ICD-10-CM | POA: Insufficient documentation

## 2017-08-20 DIAGNOSIS — M519 Unspecified thoracic, thoracolumbar and lumbosacral intervertebral disc disorder: Secondary | ICD-10-CM | POA: Insufficient documentation

## 2017-08-20 DIAGNOSIS — M5124 Other intervertebral disc displacement, thoracic region: Secondary | ICD-10-CM | POA: Insufficient documentation

## 2017-08-20 DIAGNOSIS — F331 Major depressive disorder, recurrent, moderate: Secondary | ICD-10-CM | POA: Insufficient documentation

## 2017-08-20 DIAGNOSIS — R52 Pain, unspecified: Secondary | ICD-10-CM | POA: Insufficient documentation

## 2017-08-20 DIAGNOSIS — A6920 Lyme disease, unspecified: Secondary | ICD-10-CM | POA: Insufficient documentation

## 2017-08-20 DIAGNOSIS — T148XXA Other injury of unspecified body region, initial encounter: Secondary | ICD-10-CM | POA: Insufficient documentation

## 2017-08-20 DIAGNOSIS — L858 Other specified epidermal thickening: Secondary | ICD-10-CM | POA: Insufficient documentation

## 2017-08-20 DIAGNOSIS — B351 Tinea unguium: Secondary | ICD-10-CM | POA: Insufficient documentation

## 2017-08-20 DIAGNOSIS — R509 Fever, unspecified: Secondary | ICD-10-CM | POA: Insufficient documentation

## 2017-08-20 DIAGNOSIS — K651 Peritoneal abscess: Secondary | ICD-10-CM | POA: Insufficient documentation

## 2017-08-20 DIAGNOSIS — R6884 Jaw pain: Secondary | ICD-10-CM | POA: Diagnosis not present

## 2017-08-20 DIAGNOSIS — F419 Anxiety disorder, unspecified: Secondary | ICD-10-CM | POA: Insufficient documentation

## 2017-08-20 DIAGNOSIS — M419 Scoliosis, unspecified: Secondary | ICD-10-CM | POA: Insufficient documentation

## 2017-08-24 DIAGNOSIS — R590 Localized enlarged lymph nodes: Secondary | ICD-10-CM | POA: Diagnosis not present

## 2017-08-27 DIAGNOSIS — R590 Localized enlarged lymph nodes: Secondary | ICD-10-CM | POA: Diagnosis not present

## 2017-08-27 DIAGNOSIS — M79621 Pain in right upper arm: Secondary | ICD-10-CM | POA: Diagnosis not present

## 2017-09-03 DIAGNOSIS — M67451 Ganglion, right hip: Secondary | ICD-10-CM | POA: Diagnosis not present

## 2017-09-07 DIAGNOSIS — M9901 Segmental and somatic dysfunction of cervical region: Secondary | ICD-10-CM | POA: Diagnosis not present

## 2017-09-07 DIAGNOSIS — M9903 Segmental and somatic dysfunction of lumbar region: Secondary | ICD-10-CM | POA: Diagnosis not present

## 2017-09-07 DIAGNOSIS — M542 Cervicalgia: Secondary | ICD-10-CM | POA: Diagnosis not present

## 2017-09-07 DIAGNOSIS — M545 Low back pain: Secondary | ICD-10-CM | POA: Diagnosis not present

## 2017-09-10 DIAGNOSIS — F5105 Insomnia due to other mental disorder: Secondary | ICD-10-CM | POA: Diagnosis not present

## 2017-09-10 DIAGNOSIS — M7918 Myalgia, other site: Secondary | ICD-10-CM | POA: Diagnosis not present

## 2017-09-10 DIAGNOSIS — M25511 Pain in right shoulder: Secondary | ICD-10-CM | POA: Diagnosis not present

## 2017-09-10 DIAGNOSIS — M25551 Pain in right hip: Secondary | ICD-10-CM | POA: Diagnosis not present

## 2017-09-13 DIAGNOSIS — M67451 Ganglion, right hip: Secondary | ICD-10-CM | POA: Diagnosis not present

## 2017-09-13 DIAGNOSIS — Z872 Personal history of diseases of the skin and subcutaneous tissue: Secondary | ICD-10-CM | POA: Diagnosis not present

## 2017-09-19 DIAGNOSIS — H04123 Dry eye syndrome of bilateral lacrimal glands: Secondary | ICD-10-CM | POA: Diagnosis not present

## 2017-09-21 DIAGNOSIS — K641 Second degree hemorrhoids: Secondary | ICD-10-CM | POA: Diagnosis not present

## 2017-09-21 DIAGNOSIS — M25551 Pain in right hip: Secondary | ICD-10-CM | POA: Diagnosis not present

## 2017-09-21 DIAGNOSIS — M25511 Pain in right shoulder: Secondary | ICD-10-CM | POA: Diagnosis not present

## 2017-09-21 DIAGNOSIS — M7918 Myalgia, other site: Secondary | ICD-10-CM | POA: Diagnosis not present

## 2017-09-24 DIAGNOSIS — E039 Hypothyroidism, unspecified: Secondary | ICD-10-CM | POA: Diagnosis not present

## 2017-09-24 DIAGNOSIS — M9902 Segmental and somatic dysfunction of thoracic region: Secondary | ICD-10-CM | POA: Diagnosis not present

## 2017-09-24 DIAGNOSIS — A6920 Lyme disease, unspecified: Secondary | ICD-10-CM | POA: Diagnosis not present

## 2017-09-24 DIAGNOSIS — R5383 Other fatigue: Secondary | ICD-10-CM | POA: Diagnosis not present

## 2017-09-24 DIAGNOSIS — M62838 Other muscle spasm: Secondary | ICD-10-CM | POA: Diagnosis not present

## 2017-09-27 DIAGNOSIS — R590 Localized enlarged lymph nodes: Secondary | ICD-10-CM | POA: Diagnosis not present

## 2017-09-28 DIAGNOSIS — K6389 Other specified diseases of intestine: Secondary | ICD-10-CM | POA: Diagnosis not present

## 2017-09-28 DIAGNOSIS — K5900 Constipation, unspecified: Secondary | ICD-10-CM | POA: Diagnosis not present

## 2017-09-28 DIAGNOSIS — R1013 Epigastric pain: Secondary | ICD-10-CM | POA: Diagnosis not present

## 2017-09-28 DIAGNOSIS — R110 Nausea: Secondary | ICD-10-CM | POA: Diagnosis not present

## 2017-10-05 DIAGNOSIS — M222X1 Patellofemoral disorders, right knee: Secondary | ICD-10-CM | POA: Diagnosis not present

## 2017-10-16 DIAGNOSIS — M1712 Unilateral primary osteoarthritis, left knee: Secondary | ICD-10-CM | POA: Diagnosis not present

## 2017-10-22 DIAGNOSIS — N943 Premenstrual tension syndrome: Secondary | ICD-10-CM | POA: Diagnosis not present

## 2017-10-22 DIAGNOSIS — M25511 Pain in right shoulder: Secondary | ICD-10-CM | POA: Diagnosis not present

## 2017-10-22 DIAGNOSIS — F331 Major depressive disorder, recurrent, moderate: Secondary | ICD-10-CM | POA: Diagnosis not present

## 2017-11-06 DIAGNOSIS — M791 Myalgia, unspecified site: Secondary | ICD-10-CM | POA: Diagnosis not present

## 2017-11-06 DIAGNOSIS — M62838 Other muscle spasm: Secondary | ICD-10-CM | POA: Diagnosis not present

## 2017-11-06 DIAGNOSIS — Z77120 Contact with and (suspected) exposure to mold (toxic): Secondary | ICD-10-CM | POA: Diagnosis not present

## 2017-11-06 DIAGNOSIS — M542 Cervicalgia: Secondary | ICD-10-CM | POA: Diagnosis not present

## 2017-11-06 DIAGNOSIS — M62830 Muscle spasm of back: Secondary | ICD-10-CM | POA: Diagnosis not present

## 2017-11-06 DIAGNOSIS — M9902 Segmental and somatic dysfunction of thoracic region: Secondary | ICD-10-CM | POA: Diagnosis not present

## 2017-11-06 DIAGNOSIS — A6920 Lyme disease, unspecified: Secondary | ICD-10-CM | POA: Diagnosis not present

## 2017-11-19 DIAGNOSIS — J018 Other acute sinusitis: Secondary | ICD-10-CM | POA: Diagnosis not present

## 2017-11-19 DIAGNOSIS — M7918 Myalgia, other site: Secondary | ICD-10-CM | POA: Diagnosis not present

## 2017-11-19 DIAGNOSIS — J028 Acute pharyngitis due to other specified organisms: Secondary | ICD-10-CM | POA: Diagnosis not present

## 2017-11-26 DIAGNOSIS — Z1151 Encounter for screening for human papillomavirus (HPV): Secondary | ICD-10-CM | POA: Diagnosis not present

## 2017-11-26 DIAGNOSIS — M25562 Pain in left knee: Secondary | ICD-10-CM | POA: Diagnosis not present

## 2017-11-26 DIAGNOSIS — Z01419 Encounter for gynecological examination (general) (routine) without abnormal findings: Secondary | ICD-10-CM | POA: Diagnosis not present

## 2017-11-30 DIAGNOSIS — S83242A Other tear of medial meniscus, current injury, left knee, initial encounter: Secondary | ICD-10-CM | POA: Diagnosis not present

## 2017-11-30 DIAGNOSIS — S90121A Contusion of right lesser toe(s) without damage to nail, initial encounter: Secondary | ICD-10-CM | POA: Diagnosis not present

## 2017-12-07 HISTORY — PX: KNEE SURGERY: SHX244

## 2017-12-10 DIAGNOSIS — Z77120 Contact with and (suspected) exposure to mold (toxic): Secondary | ICD-10-CM | POA: Diagnosis not present

## 2017-12-10 DIAGNOSIS — A6920 Lyme disease, unspecified: Secondary | ICD-10-CM | POA: Diagnosis not present

## 2017-12-10 DIAGNOSIS — R5383 Other fatigue: Secondary | ICD-10-CM | POA: Diagnosis not present

## 2017-12-20 DIAGNOSIS — M2242 Chondromalacia patellae, left knee: Secondary | ICD-10-CM | POA: Diagnosis not present

## 2017-12-20 DIAGNOSIS — F329 Major depressive disorder, single episode, unspecified: Secondary | ICD-10-CM | POA: Diagnosis not present

## 2017-12-20 DIAGNOSIS — M94262 Chondromalacia, left knee: Secondary | ICD-10-CM | POA: Diagnosis not present

## 2017-12-20 DIAGNOSIS — S83242A Other tear of medial meniscus, current injury, left knee, initial encounter: Secondary | ICD-10-CM | POA: Diagnosis not present

## 2017-12-20 DIAGNOSIS — M67962 Unspecified disorder of synovium and tendon, left lower leg: Secondary | ICD-10-CM | POA: Diagnosis not present

## 2017-12-20 DIAGNOSIS — M6752 Plica syndrome, left knee: Secondary | ICD-10-CM | POA: Diagnosis not present

## 2017-12-20 DIAGNOSIS — Z79899 Other long term (current) drug therapy: Secondary | ICD-10-CM | POA: Diagnosis not present

## 2017-12-20 DIAGNOSIS — M6588 Other synovitis and tenosynovitis, other site: Secondary | ICD-10-CM | POA: Diagnosis not present

## 2017-12-20 DIAGNOSIS — F419 Anxiety disorder, unspecified: Secondary | ICD-10-CM | POA: Diagnosis not present

## 2017-12-24 DIAGNOSIS — R2689 Other abnormalities of gait and mobility: Secondary | ICD-10-CM | POA: Diagnosis not present

## 2017-12-24 DIAGNOSIS — M6281 Muscle weakness (generalized): Secondary | ICD-10-CM | POA: Diagnosis not present

## 2017-12-24 DIAGNOSIS — M25562 Pain in left knee: Secondary | ICD-10-CM | POA: Diagnosis not present

## 2017-12-24 DIAGNOSIS — M25462 Effusion, left knee: Secondary | ICD-10-CM | POA: Diagnosis not present

## 2017-12-28 DIAGNOSIS — R2689 Other abnormalities of gait and mobility: Secondary | ICD-10-CM | POA: Diagnosis not present

## 2017-12-28 DIAGNOSIS — M25562 Pain in left knee: Secondary | ICD-10-CM | POA: Diagnosis not present

## 2017-12-28 DIAGNOSIS — M25462 Effusion, left knee: Secondary | ICD-10-CM | POA: Diagnosis not present

## 2017-12-28 DIAGNOSIS — M6281 Muscle weakness (generalized): Secondary | ICD-10-CM | POA: Diagnosis not present

## 2017-12-31 DIAGNOSIS — M25462 Effusion, left knee: Secondary | ICD-10-CM | POA: Diagnosis not present

## 2017-12-31 DIAGNOSIS — M9901 Segmental and somatic dysfunction of cervical region: Secondary | ICD-10-CM | POA: Diagnosis not present

## 2017-12-31 DIAGNOSIS — M542 Cervicalgia: Secondary | ICD-10-CM | POA: Diagnosis not present

## 2017-12-31 DIAGNOSIS — M6281 Muscle weakness (generalized): Secondary | ICD-10-CM | POA: Diagnosis not present

## 2017-12-31 DIAGNOSIS — M9903 Segmental and somatic dysfunction of lumbar region: Secondary | ICD-10-CM | POA: Diagnosis not present

## 2017-12-31 DIAGNOSIS — M545 Low back pain: Secondary | ICD-10-CM | POA: Diagnosis not present

## 2017-12-31 DIAGNOSIS — M25562 Pain in left knee: Secondary | ICD-10-CM | POA: Diagnosis not present

## 2017-12-31 DIAGNOSIS — R2689 Other abnormalities of gait and mobility: Secondary | ICD-10-CM | POA: Diagnosis not present

## 2018-01-04 DIAGNOSIS — M25462 Effusion, left knee: Secondary | ICD-10-CM | POA: Diagnosis not present

## 2018-01-04 DIAGNOSIS — M6281 Muscle weakness (generalized): Secondary | ICD-10-CM | POA: Diagnosis not present

## 2018-01-04 DIAGNOSIS — M25562 Pain in left knee: Secondary | ICD-10-CM | POA: Diagnosis not present

## 2018-01-04 DIAGNOSIS — M9901 Segmental and somatic dysfunction of cervical region: Secondary | ICD-10-CM | POA: Diagnosis not present

## 2018-01-04 DIAGNOSIS — M542 Cervicalgia: Secondary | ICD-10-CM | POA: Diagnosis not present

## 2018-01-04 DIAGNOSIS — M545 Low back pain: Secondary | ICD-10-CM | POA: Diagnosis not present

## 2018-01-04 DIAGNOSIS — R2689 Other abnormalities of gait and mobility: Secondary | ICD-10-CM | POA: Diagnosis not present

## 2018-01-04 DIAGNOSIS — M9903 Segmental and somatic dysfunction of lumbar region: Secondary | ICD-10-CM | POA: Diagnosis not present

## 2018-01-07 DIAGNOSIS — M9901 Segmental and somatic dysfunction of cervical region: Secondary | ICD-10-CM | POA: Diagnosis not present

## 2018-01-07 DIAGNOSIS — M25562 Pain in left knee: Secondary | ICD-10-CM | POA: Diagnosis not present

## 2018-01-07 DIAGNOSIS — M545 Low back pain: Secondary | ICD-10-CM | POA: Diagnosis not present

## 2018-01-07 DIAGNOSIS — M542 Cervicalgia: Secondary | ICD-10-CM | POA: Diagnosis not present

## 2018-01-07 DIAGNOSIS — M6281 Muscle weakness (generalized): Secondary | ICD-10-CM | POA: Diagnosis not present

## 2018-01-07 DIAGNOSIS — R2689 Other abnormalities of gait and mobility: Secondary | ICD-10-CM | POA: Diagnosis not present

## 2018-01-07 DIAGNOSIS — M9903 Segmental and somatic dysfunction of lumbar region: Secondary | ICD-10-CM | POA: Diagnosis not present

## 2018-01-07 DIAGNOSIS — M25462 Effusion, left knee: Secondary | ICD-10-CM | POA: Diagnosis not present

## 2018-01-11 DIAGNOSIS — R2689 Other abnormalities of gait and mobility: Secondary | ICD-10-CM | POA: Diagnosis not present

## 2018-01-11 DIAGNOSIS — M25462 Effusion, left knee: Secondary | ICD-10-CM | POA: Diagnosis not present

## 2018-01-11 DIAGNOSIS — M25562 Pain in left knee: Secondary | ICD-10-CM | POA: Diagnosis not present

## 2018-01-11 DIAGNOSIS — M6281 Muscle weakness (generalized): Secondary | ICD-10-CM | POA: Diagnosis not present

## 2018-01-14 DIAGNOSIS — M25562 Pain in left knee: Secondary | ICD-10-CM | POA: Diagnosis not present

## 2018-01-14 DIAGNOSIS — M9901 Segmental and somatic dysfunction of cervical region: Secondary | ICD-10-CM | POA: Diagnosis not present

## 2018-01-14 DIAGNOSIS — M6281 Muscle weakness (generalized): Secondary | ICD-10-CM | POA: Diagnosis not present

## 2018-01-14 DIAGNOSIS — M545 Low back pain: Secondary | ICD-10-CM | POA: Diagnosis not present

## 2018-01-14 DIAGNOSIS — R2689 Other abnormalities of gait and mobility: Secondary | ICD-10-CM | POA: Diagnosis not present

## 2018-01-14 DIAGNOSIS — M25462 Effusion, left knee: Secondary | ICD-10-CM | POA: Diagnosis not present

## 2018-01-14 DIAGNOSIS — M542 Cervicalgia: Secondary | ICD-10-CM | POA: Diagnosis not present

## 2018-01-14 DIAGNOSIS — M9903 Segmental and somatic dysfunction of lumbar region: Secondary | ICD-10-CM | POA: Diagnosis not present

## 2018-01-18 DIAGNOSIS — R2689 Other abnormalities of gait and mobility: Secondary | ICD-10-CM | POA: Diagnosis not present

## 2018-01-18 DIAGNOSIS — M25562 Pain in left knee: Secondary | ICD-10-CM | POA: Diagnosis not present

## 2018-01-18 DIAGNOSIS — M25462 Effusion, left knee: Secondary | ICD-10-CM | POA: Diagnosis not present

## 2018-01-18 DIAGNOSIS — M6281 Muscle weakness (generalized): Secondary | ICD-10-CM | POA: Diagnosis not present

## 2018-01-22 DIAGNOSIS — J018 Other acute sinusitis: Secondary | ICD-10-CM | POA: Diagnosis not present

## 2018-01-28 DIAGNOSIS — M25462 Effusion, left knee: Secondary | ICD-10-CM | POA: Diagnosis not present

## 2018-01-28 DIAGNOSIS — M6281 Muscle weakness (generalized): Secondary | ICD-10-CM | POA: Diagnosis not present

## 2018-01-28 DIAGNOSIS — R2689 Other abnormalities of gait and mobility: Secondary | ICD-10-CM | POA: Diagnosis not present

## 2018-01-28 DIAGNOSIS — Z9889 Other specified postprocedural states: Secondary | ICD-10-CM | POA: Diagnosis not present

## 2018-01-28 DIAGNOSIS — M25562 Pain in left knee: Secondary | ICD-10-CM | POA: Diagnosis not present

## 2018-01-29 DIAGNOSIS — M25511 Pain in right shoulder: Secondary | ICD-10-CM | POA: Diagnosis not present

## 2018-01-29 DIAGNOSIS — F331 Major depressive disorder, recurrent, moderate: Secondary | ICD-10-CM | POA: Diagnosis not present

## 2018-01-29 DIAGNOSIS — N943 Premenstrual tension syndrome: Secondary | ICD-10-CM | POA: Diagnosis not present

## 2018-02-06 DIAGNOSIS — R2689 Other abnormalities of gait and mobility: Secondary | ICD-10-CM | POA: Diagnosis not present

## 2018-02-06 DIAGNOSIS — M6281 Muscle weakness (generalized): Secondary | ICD-10-CM | POA: Diagnosis not present

## 2018-02-06 DIAGNOSIS — M25462 Effusion, left knee: Secondary | ICD-10-CM | POA: Diagnosis not present

## 2018-02-06 DIAGNOSIS — M25562 Pain in left knee: Secondary | ICD-10-CM | POA: Diagnosis not present

## 2018-02-11 DIAGNOSIS — M6281 Muscle weakness (generalized): Secondary | ICD-10-CM | POA: Diagnosis not present

## 2018-02-11 DIAGNOSIS — M25462 Effusion, left knee: Secondary | ICD-10-CM | POA: Diagnosis not present

## 2018-02-11 DIAGNOSIS — M25562 Pain in left knee: Secondary | ICD-10-CM | POA: Diagnosis not present

## 2018-02-11 DIAGNOSIS — R2689 Other abnormalities of gait and mobility: Secondary | ICD-10-CM | POA: Diagnosis not present

## 2018-02-12 DIAGNOSIS — R1084 Generalized abdominal pain: Secondary | ICD-10-CM | POA: Diagnosis not present

## 2018-02-12 DIAGNOSIS — K625 Hemorrhage of anus and rectum: Secondary | ICD-10-CM | POA: Diagnosis not present

## 2018-02-12 DIAGNOSIS — K602 Anal fissure, unspecified: Secondary | ICD-10-CM | POA: Diagnosis not present

## 2018-02-12 DIAGNOSIS — K644 Residual hemorrhoidal skin tags: Secondary | ICD-10-CM | POA: Diagnosis not present

## 2018-02-25 DIAGNOSIS — M25462 Effusion, left knee: Secondary | ICD-10-CM | POA: Diagnosis not present

## 2018-02-25 DIAGNOSIS — M25562 Pain in left knee: Secondary | ICD-10-CM | POA: Diagnosis not present

## 2018-02-25 DIAGNOSIS — R2689 Other abnormalities of gait and mobility: Secondary | ICD-10-CM | POA: Diagnosis not present

## 2018-02-25 DIAGNOSIS — M6281 Muscle weakness (generalized): Secondary | ICD-10-CM | POA: Diagnosis not present

## 2018-02-27 DIAGNOSIS — R51 Headache: Secondary | ICD-10-CM | POA: Diagnosis not present

## 2018-02-27 DIAGNOSIS — G43009 Migraine without aura, not intractable, without status migrainosus: Secondary | ICD-10-CM | POA: Diagnosis not present

## 2018-03-08 DIAGNOSIS — R2689 Other abnormalities of gait and mobility: Secondary | ICD-10-CM | POA: Diagnosis not present

## 2018-03-08 DIAGNOSIS — M6281 Muscle weakness (generalized): Secondary | ICD-10-CM | POA: Diagnosis not present

## 2018-03-08 DIAGNOSIS — M25462 Effusion, left knee: Secondary | ICD-10-CM | POA: Diagnosis not present

## 2018-03-08 DIAGNOSIS — M25561 Pain in right knee: Secondary | ICD-10-CM | POA: Diagnosis not present

## 2018-03-08 DIAGNOSIS — M25562 Pain in left knee: Secondary | ICD-10-CM | POA: Diagnosis not present

## 2018-03-11 DIAGNOSIS — M222X1 Patellofemoral disorders, right knee: Secondary | ICD-10-CM | POA: Diagnosis not present

## 2018-03-15 DIAGNOSIS — R51 Headache: Secondary | ICD-10-CM | POA: Diagnosis not present

## 2018-03-18 DIAGNOSIS — M545 Low back pain: Secondary | ICD-10-CM | POA: Diagnosis not present

## 2018-03-18 DIAGNOSIS — M9901 Segmental and somatic dysfunction of cervical region: Secondary | ICD-10-CM | POA: Diagnosis not present

## 2018-03-18 DIAGNOSIS — M9903 Segmental and somatic dysfunction of lumbar region: Secondary | ICD-10-CM | POA: Diagnosis not present

## 2018-03-18 DIAGNOSIS — M542 Cervicalgia: Secondary | ICD-10-CM | POA: Diagnosis not present

## 2018-03-25 DIAGNOSIS — M545 Low back pain: Secondary | ICD-10-CM | POA: Diagnosis not present

## 2018-03-25 DIAGNOSIS — M9903 Segmental and somatic dysfunction of lumbar region: Secondary | ICD-10-CM | POA: Diagnosis not present

## 2018-03-25 DIAGNOSIS — M542 Cervicalgia: Secondary | ICD-10-CM | POA: Diagnosis not present

## 2018-03-25 DIAGNOSIS — M9901 Segmental and somatic dysfunction of cervical region: Secondary | ICD-10-CM | POA: Diagnosis not present

## 2018-04-10 DIAGNOSIS — M222X1 Patellofemoral disorders, right knee: Secondary | ICD-10-CM | POA: Diagnosis not present

## 2018-04-12 DIAGNOSIS — M9901 Segmental and somatic dysfunction of cervical region: Secondary | ICD-10-CM | POA: Diagnosis not present

## 2018-04-12 DIAGNOSIS — M9903 Segmental and somatic dysfunction of lumbar region: Secondary | ICD-10-CM | POA: Diagnosis not present

## 2018-04-12 DIAGNOSIS — M545 Low back pain: Secondary | ICD-10-CM | POA: Diagnosis not present

## 2018-04-12 DIAGNOSIS — M542 Cervicalgia: Secondary | ICD-10-CM | POA: Diagnosis not present

## 2018-04-15 DIAGNOSIS — G44221 Chronic tension-type headache, intractable: Secondary | ICD-10-CM | POA: Diagnosis not present

## 2018-05-08 DIAGNOSIS — M222X1 Patellofemoral disorders, right knee: Secondary | ICD-10-CM | POA: Diagnosis not present

## 2018-05-08 DIAGNOSIS — M9901 Segmental and somatic dysfunction of cervical region: Secondary | ICD-10-CM | POA: Diagnosis not present

## 2018-05-08 DIAGNOSIS — M545 Low back pain: Secondary | ICD-10-CM | POA: Diagnosis not present

## 2018-05-08 DIAGNOSIS — D7589 Other specified diseases of blood and blood-forming organs: Secondary | ICD-10-CM | POA: Diagnosis not present

## 2018-05-08 DIAGNOSIS — M9903 Segmental and somatic dysfunction of lumbar region: Secondary | ICD-10-CM | POA: Diagnosis not present

## 2018-05-08 DIAGNOSIS — M542 Cervicalgia: Secondary | ICD-10-CM | POA: Diagnosis not present

## 2018-05-08 DIAGNOSIS — M4126 Other idiopathic scoliosis, lumbar region: Secondary | ICD-10-CM | POA: Diagnosis not present

## 2018-05-21 DIAGNOSIS — R197 Diarrhea, unspecified: Secondary | ICD-10-CM | POA: Diagnosis not present

## 2018-05-21 DIAGNOSIS — R110 Nausea: Secondary | ICD-10-CM | POA: Diagnosis not present

## 2018-05-21 DIAGNOSIS — R1084 Generalized abdominal pain: Secondary | ICD-10-CM | POA: Diagnosis not present

## 2018-05-23 DIAGNOSIS — R197 Diarrhea, unspecified: Secondary | ICD-10-CM | POA: Diagnosis not present

## 2018-05-30 DIAGNOSIS — M9903 Segmental and somatic dysfunction of lumbar region: Secondary | ICD-10-CM | POA: Diagnosis not present

## 2018-05-30 DIAGNOSIS — M9901 Segmental and somatic dysfunction of cervical region: Secondary | ICD-10-CM | POA: Diagnosis not present

## 2018-05-30 DIAGNOSIS — M542 Cervicalgia: Secondary | ICD-10-CM | POA: Diagnosis not present

## 2018-05-30 DIAGNOSIS — M545 Low back pain: Secondary | ICD-10-CM | POA: Diagnosis not present

## 2018-06-12 DIAGNOSIS — M4126 Other idiopathic scoliosis, lumbar region: Secondary | ICD-10-CM | POA: Diagnosis not present

## 2018-06-12 DIAGNOSIS — M545 Low back pain: Secondary | ICD-10-CM | POA: Diagnosis not present

## 2018-06-12 DIAGNOSIS — M9901 Segmental and somatic dysfunction of cervical region: Secondary | ICD-10-CM | POA: Diagnosis not present

## 2018-06-12 DIAGNOSIS — M542 Cervicalgia: Secondary | ICD-10-CM | POA: Diagnosis not present

## 2018-06-12 DIAGNOSIS — M9903 Segmental and somatic dysfunction of lumbar region: Secondary | ICD-10-CM | POA: Diagnosis not present

## 2018-06-14 DIAGNOSIS — M4185 Other forms of scoliosis, thoracolumbar region: Secondary | ICD-10-CM | POA: Diagnosis not present

## 2018-06-14 DIAGNOSIS — M545 Low back pain: Secondary | ICD-10-CM | POA: Diagnosis not present

## 2018-06-20 DIAGNOSIS — M4600 Spinal enthesopathy, site unspecified: Secondary | ICD-10-CM | POA: Diagnosis not present

## 2018-06-20 DIAGNOSIS — R5383 Other fatigue: Secondary | ICD-10-CM | POA: Diagnosis not present

## 2018-06-20 DIAGNOSIS — Z77120 Contact with and (suspected) exposure to mold (toxic): Secondary | ICD-10-CM | POA: Diagnosis not present

## 2018-06-28 DIAGNOSIS — N941 Unspecified dyspareunia: Secondary | ICD-10-CM | POA: Diagnosis not present

## 2018-06-28 DIAGNOSIS — R6882 Decreased libido: Secondary | ICD-10-CM | POA: Diagnosis not present

## 2018-07-05 DIAGNOSIS — M542 Cervicalgia: Secondary | ICD-10-CM | POA: Diagnosis not present

## 2018-07-05 DIAGNOSIS — M9901 Segmental and somatic dysfunction of cervical region: Secondary | ICD-10-CM | POA: Diagnosis not present

## 2018-07-05 DIAGNOSIS — N941 Unspecified dyspareunia: Secondary | ICD-10-CM | POA: Diagnosis not present

## 2018-07-05 DIAGNOSIS — M545 Low back pain: Secondary | ICD-10-CM | POA: Diagnosis not present

## 2018-07-05 DIAGNOSIS — M9903 Segmental and somatic dysfunction of lumbar region: Secondary | ICD-10-CM | POA: Diagnosis not present

## 2018-07-09 LAB — HM COLONOSCOPY

## 2018-07-12 DIAGNOSIS — M5431 Sciatica, right side: Secondary | ICD-10-CM | POA: Diagnosis not present

## 2018-07-12 DIAGNOSIS — M5116 Intervertebral disc disorders with radiculopathy, lumbar region: Secondary | ICD-10-CM | POA: Diagnosis not present

## 2018-07-12 DIAGNOSIS — M419 Scoliosis, unspecified: Secondary | ICD-10-CM | POA: Diagnosis not present

## 2018-07-12 DIAGNOSIS — M545 Low back pain: Secondary | ICD-10-CM | POA: Diagnosis not present

## 2018-07-15 DIAGNOSIS — M545 Low back pain: Secondary | ICD-10-CM | POA: Diagnosis not present

## 2018-07-15 DIAGNOSIS — M9903 Segmental and somatic dysfunction of lumbar region: Secondary | ICD-10-CM | POA: Diagnosis not present

## 2018-07-15 DIAGNOSIS — L853 Xerosis cutis: Secondary | ICD-10-CM | POA: Diagnosis not present

## 2018-07-15 DIAGNOSIS — M62830 Muscle spasm of back: Secondary | ICD-10-CM | POA: Diagnosis not present

## 2018-07-15 DIAGNOSIS — M542 Cervicalgia: Secondary | ICD-10-CM | POA: Diagnosis not present

## 2018-07-15 DIAGNOSIS — M4126 Other idiopathic scoliosis, lumbar region: Secondary | ICD-10-CM | POA: Diagnosis not present

## 2018-07-15 DIAGNOSIS — M9901 Segmental and somatic dysfunction of cervical region: Secondary | ICD-10-CM | POA: Diagnosis not present

## 2018-07-20 DIAGNOSIS — M5126 Other intervertebral disc displacement, lumbar region: Secondary | ICD-10-CM | POA: Diagnosis not present

## 2018-07-20 DIAGNOSIS — M419 Scoliosis, unspecified: Secondary | ICD-10-CM | POA: Diagnosis not present

## 2018-07-23 DIAGNOSIS — K602 Anal fissure, unspecified: Secondary | ICD-10-CM | POA: Insufficient documentation

## 2018-07-23 DIAGNOSIS — K6289 Other specified diseases of anus and rectum: Secondary | ICD-10-CM | POA: Diagnosis not present

## 2018-07-23 DIAGNOSIS — Z87891 Personal history of nicotine dependence: Secondary | ICD-10-CM | POA: Diagnosis not present

## 2018-07-23 DIAGNOSIS — K648 Other hemorrhoids: Secondary | ICD-10-CM | POA: Diagnosis not present

## 2018-07-25 DIAGNOSIS — K620 Anal polyp: Secondary | ICD-10-CM | POA: Diagnosis not present

## 2018-07-25 DIAGNOSIS — Z87891 Personal history of nicotine dependence: Secondary | ICD-10-CM | POA: Diagnosis not present

## 2018-07-25 DIAGNOSIS — Z8659 Personal history of other mental and behavioral disorders: Secondary | ICD-10-CM | POA: Diagnosis not present

## 2018-07-25 DIAGNOSIS — K649 Unspecified hemorrhoids: Secondary | ICD-10-CM | POA: Diagnosis not present

## 2018-07-25 DIAGNOSIS — R1030 Lower abdominal pain, unspecified: Secondary | ICD-10-CM | POA: Diagnosis not present

## 2018-07-25 DIAGNOSIS — K6289 Other specified diseases of anus and rectum: Secondary | ICD-10-CM | POA: Diagnosis not present

## 2018-07-25 DIAGNOSIS — R197 Diarrhea, unspecified: Secondary | ICD-10-CM | POA: Diagnosis not present

## 2018-07-25 DIAGNOSIS — K602 Anal fissure, unspecified: Secondary | ICD-10-CM | POA: Diagnosis not present

## 2018-07-26 DIAGNOSIS — M5431 Sciatica, right side: Secondary | ICD-10-CM | POA: Diagnosis not present

## 2018-07-26 DIAGNOSIS — M419 Scoliosis, unspecified: Secondary | ICD-10-CM | POA: Diagnosis not present

## 2018-07-26 DIAGNOSIS — M5116 Intervertebral disc disorders with radiculopathy, lumbar region: Secondary | ICD-10-CM | POA: Diagnosis not present

## 2018-08-01 DIAGNOSIS — M5416 Radiculopathy, lumbar region: Secondary | ICD-10-CM | POA: Diagnosis not present

## 2018-08-01 DIAGNOSIS — M5136 Other intervertebral disc degeneration, lumbar region: Secondary | ICD-10-CM | POA: Diagnosis not present

## 2018-08-01 DIAGNOSIS — M419 Scoliosis, unspecified: Secondary | ICD-10-CM | POA: Diagnosis not present

## 2018-08-14 DIAGNOSIS — M5416 Radiculopathy, lumbar region: Secondary | ICD-10-CM | POA: Diagnosis not present

## 2018-08-19 DIAGNOSIS — J00 Acute nasopharyngitis [common cold]: Secondary | ICD-10-CM | POA: Diagnosis not present

## 2018-08-19 DIAGNOSIS — R194 Change in bowel habit: Secondary | ICD-10-CM | POA: Diagnosis not present

## 2018-08-19 DIAGNOSIS — M7918 Myalgia, other site: Secondary | ICD-10-CM | POA: Diagnosis not present

## 2018-08-27 DIAGNOSIS — M545 Low back pain: Secondary | ICD-10-CM | POA: Diagnosis not present

## 2018-08-27 DIAGNOSIS — M5136 Other intervertebral disc degeneration, lumbar region: Secondary | ICD-10-CM | POA: Diagnosis not present

## 2018-08-27 DIAGNOSIS — M419 Scoliosis, unspecified: Secondary | ICD-10-CM | POA: Diagnosis not present

## 2018-08-27 DIAGNOSIS — M5416 Radiculopathy, lumbar region: Secondary | ICD-10-CM | POA: Diagnosis not present

## 2018-08-27 DIAGNOSIS — K602 Anal fissure, unspecified: Secondary | ICD-10-CM | POA: Diagnosis not present

## 2018-08-30 DIAGNOSIS — R61 Generalized hyperhidrosis: Secondary | ICD-10-CM | POA: Diagnosis not present

## 2018-08-30 DIAGNOSIS — R232 Flushing: Secondary | ICD-10-CM | POA: Diagnosis not present

## 2018-09-03 DIAGNOSIS — J060 Acute laryngopharyngitis: Secondary | ICD-10-CM | POA: Diagnosis not present

## 2018-09-10 DIAGNOSIS — M47816 Spondylosis without myelopathy or radiculopathy, lumbar region: Secondary | ICD-10-CM | POA: Diagnosis not present

## 2018-09-17 DIAGNOSIS — M5416 Radiculopathy, lumbar region: Secondary | ICD-10-CM | POA: Diagnosis not present

## 2018-09-20 DIAGNOSIS — H04123 Dry eye syndrome of bilateral lacrimal glands: Secondary | ICD-10-CM | POA: Diagnosis not present

## 2018-09-25 DIAGNOSIS — M47816 Spondylosis without myelopathy or radiculopathy, lumbar region: Secondary | ICD-10-CM | POA: Diagnosis not present

## 2018-09-30 DIAGNOSIS — N946 Dysmenorrhea, unspecified: Secondary | ICD-10-CM | POA: Diagnosis not present

## 2018-09-30 DIAGNOSIS — N926 Irregular menstruation, unspecified: Secondary | ICD-10-CM | POA: Diagnosis not present

## 2018-09-30 DIAGNOSIS — R232 Flushing: Secondary | ICD-10-CM | POA: Diagnosis not present

## 2018-09-30 DIAGNOSIS — R61 Generalized hyperhidrosis: Secondary | ICD-10-CM | POA: Diagnosis not present

## 2018-10-02 DIAGNOSIS — N946 Dysmenorrhea, unspecified: Secondary | ICD-10-CM | POA: Diagnosis not present

## 2018-10-02 DIAGNOSIS — R61 Generalized hyperhidrosis: Secondary | ICD-10-CM | POA: Diagnosis not present

## 2018-10-02 DIAGNOSIS — N926 Irregular menstruation, unspecified: Secondary | ICD-10-CM | POA: Diagnosis not present

## 2018-10-02 DIAGNOSIS — R232 Flushing: Secondary | ICD-10-CM | POA: Diagnosis not present

## 2018-10-04 DIAGNOSIS — R61 Generalized hyperhidrosis: Secondary | ICD-10-CM | POA: Diagnosis not present

## 2018-10-04 DIAGNOSIS — N946 Dysmenorrhea, unspecified: Secondary | ICD-10-CM | POA: Diagnosis not present

## 2018-10-04 DIAGNOSIS — R232 Flushing: Secondary | ICD-10-CM | POA: Diagnosis not present

## 2018-10-04 DIAGNOSIS — N926 Irregular menstruation, unspecified: Secondary | ICD-10-CM | POA: Diagnosis not present

## 2018-10-06 DIAGNOSIS — N926 Irregular menstruation, unspecified: Secondary | ICD-10-CM | POA: Diagnosis not present

## 2018-10-06 DIAGNOSIS — N946 Dysmenorrhea, unspecified: Secondary | ICD-10-CM | POA: Diagnosis not present

## 2018-10-06 DIAGNOSIS — R232 Flushing: Secondary | ICD-10-CM | POA: Diagnosis not present

## 2018-10-06 DIAGNOSIS — R61 Generalized hyperhidrosis: Secondary | ICD-10-CM | POA: Diagnosis not present

## 2018-10-08 DIAGNOSIS — N946 Dysmenorrhea, unspecified: Secondary | ICD-10-CM | POA: Diagnosis not present

## 2018-10-08 DIAGNOSIS — N926 Irregular menstruation, unspecified: Secondary | ICD-10-CM | POA: Diagnosis not present

## 2018-10-08 DIAGNOSIS — R61 Generalized hyperhidrosis: Secondary | ICD-10-CM | POA: Diagnosis not present

## 2018-10-08 DIAGNOSIS — R232 Flushing: Secondary | ICD-10-CM | POA: Diagnosis not present

## 2018-10-10 DIAGNOSIS — N946 Dysmenorrhea, unspecified: Secondary | ICD-10-CM | POA: Diagnosis not present

## 2018-10-10 DIAGNOSIS — N926 Irregular menstruation, unspecified: Secondary | ICD-10-CM | POA: Diagnosis not present

## 2018-10-10 DIAGNOSIS — R232 Flushing: Secondary | ICD-10-CM | POA: Diagnosis not present

## 2018-10-10 DIAGNOSIS — R61 Generalized hyperhidrosis: Secondary | ICD-10-CM | POA: Diagnosis not present

## 2018-10-15 DIAGNOSIS — E28310 Symptomatic premature menopause: Secondary | ICD-10-CM | POA: Diagnosis not present

## 2018-11-05 DIAGNOSIS — M5416 Radiculopathy, lumbar region: Secondary | ICD-10-CM | POA: Diagnosis not present

## 2018-11-11 DIAGNOSIS — M9903 Segmental and somatic dysfunction of lumbar region: Secondary | ICD-10-CM | POA: Diagnosis not present

## 2018-11-11 DIAGNOSIS — M542 Cervicalgia: Secondary | ICD-10-CM | POA: Diagnosis not present

## 2018-11-11 DIAGNOSIS — M9901 Segmental and somatic dysfunction of cervical region: Secondary | ICD-10-CM | POA: Diagnosis not present

## 2018-11-11 DIAGNOSIS — R110 Nausea: Secondary | ICD-10-CM | POA: Diagnosis not present

## 2018-11-11 DIAGNOSIS — M545 Low back pain: Secondary | ICD-10-CM | POA: Diagnosis not present

## 2018-11-14 DIAGNOSIS — M9901 Segmental and somatic dysfunction of cervical region: Secondary | ICD-10-CM | POA: Diagnosis not present

## 2018-11-14 DIAGNOSIS — M7918 Myalgia, other site: Secondary | ICD-10-CM | POA: Diagnosis not present

## 2018-11-14 DIAGNOSIS — M9903 Segmental and somatic dysfunction of lumbar region: Secondary | ICD-10-CM | POA: Diagnosis not present

## 2018-11-14 DIAGNOSIS — M545 Low back pain: Secondary | ICD-10-CM | POA: Diagnosis not present

## 2018-11-14 DIAGNOSIS — M5416 Radiculopathy, lumbar region: Secondary | ICD-10-CM | POA: Diagnosis not present

## 2018-11-14 DIAGNOSIS — M5116 Intervertebral disc disorders with radiculopathy, lumbar region: Secondary | ICD-10-CM | POA: Diagnosis not present

## 2018-11-14 DIAGNOSIS — M47816 Spondylosis without myelopathy or radiculopathy, lumbar region: Secondary | ICD-10-CM | POA: Diagnosis not present

## 2018-11-14 DIAGNOSIS — M5136 Other intervertebral disc degeneration, lumbar region: Secondary | ICD-10-CM | POA: Diagnosis not present

## 2018-11-14 DIAGNOSIS — M542 Cervicalgia: Secondary | ICD-10-CM | POA: Diagnosis not present

## 2018-12-07 DIAGNOSIS — M25551 Pain in right hip: Secondary | ICD-10-CM | POA: Diagnosis not present

## 2018-12-12 DIAGNOSIS — M7918 Myalgia, other site: Secondary | ICD-10-CM | POA: Diagnosis not present

## 2018-12-12 DIAGNOSIS — M419 Scoliosis, unspecified: Secondary | ICD-10-CM | POA: Diagnosis not present

## 2018-12-12 DIAGNOSIS — M25551 Pain in right hip: Secondary | ICD-10-CM | POA: Diagnosis not present

## 2018-12-12 DIAGNOSIS — M5116 Intervertebral disc disorders with radiculopathy, lumbar region: Secondary | ICD-10-CM | POA: Diagnosis not present

## 2018-12-23 DIAGNOSIS — N926 Irregular menstruation, unspecified: Secondary | ICD-10-CM | POA: Diagnosis not present

## 2018-12-23 DIAGNOSIS — R6882 Decreased libido: Secondary | ICD-10-CM | POA: Diagnosis not present

## 2018-12-23 DIAGNOSIS — N946 Dysmenorrhea, unspecified: Secondary | ICD-10-CM | POA: Diagnosis not present

## 2018-12-23 DIAGNOSIS — N941 Unspecified dyspareunia: Secondary | ICD-10-CM | POA: Diagnosis not present

## 2019-01-08 DIAGNOSIS — K602 Anal fissure, unspecified: Secondary | ICD-10-CM | POA: Diagnosis not present

## 2019-01-09 DIAGNOSIS — K602 Anal fissure, unspecified: Secondary | ICD-10-CM | POA: Diagnosis not present

## 2019-01-09 DIAGNOSIS — Z87891 Personal history of nicotine dependence: Secondary | ICD-10-CM | POA: Diagnosis not present

## 2019-01-11 HISTORY — PX: ANAL FISSURE REPAIR: SHX2312

## 2019-02-13 DIAGNOSIS — J019 Acute sinusitis, unspecified: Secondary | ICD-10-CM | POA: Diagnosis not present

## 2019-02-13 DIAGNOSIS — Z77120 Contact with and (suspected) exposure to mold (toxic): Secondary | ICD-10-CM | POA: Diagnosis not present

## 2019-02-13 DIAGNOSIS — A6920 Lyme disease, unspecified: Secondary | ICD-10-CM | POA: Diagnosis not present

## 2019-02-13 DIAGNOSIS — G44209 Tension-type headache, unspecified, not intractable: Secondary | ICD-10-CM | POA: Diagnosis not present

## 2019-03-07 DIAGNOSIS — Z1151 Encounter for screening for human papillomavirus (HPV): Secondary | ICD-10-CM | POA: Diagnosis not present

## 2019-03-07 DIAGNOSIS — Z01419 Encounter for gynecological examination (general) (routine) without abnormal findings: Secondary | ICD-10-CM | POA: Diagnosis not present

## 2019-04-03 DIAGNOSIS — N941 Unspecified dyspareunia: Secondary | ICD-10-CM | POA: Diagnosis not present

## 2019-04-03 DIAGNOSIS — R6882 Decreased libido: Secondary | ICD-10-CM | POA: Diagnosis not present

## 2019-04-03 DIAGNOSIS — N946 Dysmenorrhea, unspecified: Secondary | ICD-10-CM | POA: Diagnosis not present

## 2019-04-03 DIAGNOSIS — R232 Flushing: Secondary | ICD-10-CM | POA: Diagnosis not present

## 2019-04-21 DIAGNOSIS — J018 Other acute sinusitis: Secondary | ICD-10-CM | POA: Diagnosis not present

## 2019-04-23 DIAGNOSIS — J018 Other acute sinusitis: Secondary | ICD-10-CM | POA: Diagnosis not present

## 2019-04-23 DIAGNOSIS — R0602 Shortness of breath: Secondary | ICD-10-CM | POA: Diagnosis not present

## 2019-04-23 DIAGNOSIS — R05 Cough: Secondary | ICD-10-CM | POA: Diagnosis not present

## 2019-05-16 DIAGNOSIS — J018 Other acute sinusitis: Secondary | ICD-10-CM | POA: Diagnosis not present

## 2019-05-16 DIAGNOSIS — Z20828 Contact with and (suspected) exposure to other viral communicable diseases: Secondary | ICD-10-CM | POA: Diagnosis not present

## 2019-05-16 DIAGNOSIS — R05 Cough: Secondary | ICD-10-CM | POA: Diagnosis not present

## 2019-06-19 DIAGNOSIS — A6920 Lyme disease, unspecified: Secondary | ICD-10-CM | POA: Diagnosis not present

## 2019-06-19 DIAGNOSIS — E559 Vitamin D deficiency, unspecified: Secondary | ICD-10-CM | POA: Diagnosis not present

## 2019-06-19 DIAGNOSIS — E639 Nutritional deficiency, unspecified: Secondary | ICD-10-CM | POA: Diagnosis not present

## 2019-06-19 DIAGNOSIS — R5383 Other fatigue: Secondary | ICD-10-CM | POA: Diagnosis not present

## 2019-06-19 DIAGNOSIS — G44209 Tension-type headache, unspecified, not intractable: Secondary | ICD-10-CM | POA: Diagnosis not present

## 2019-07-14 DIAGNOSIS — J0101 Acute recurrent maxillary sinusitis: Secondary | ICD-10-CM | POA: Diagnosis not present

## 2019-07-14 DIAGNOSIS — Z1159 Encounter for screening for other viral diseases: Secondary | ICD-10-CM | POA: Diagnosis not present

## 2019-07-22 DIAGNOSIS — Z1159 Encounter for screening for other viral diseases: Secondary | ICD-10-CM | POA: Diagnosis not present

## 2019-07-22 DIAGNOSIS — J0101 Acute recurrent maxillary sinusitis: Secondary | ICD-10-CM | POA: Diagnosis not present

## 2019-08-05 DIAGNOSIS — R5383 Other fatigue: Secondary | ICD-10-CM | POA: Diagnosis not present

## 2019-08-05 DIAGNOSIS — N941 Unspecified dyspareunia: Secondary | ICD-10-CM | POA: Diagnosis not present

## 2019-08-05 DIAGNOSIS — E559 Vitamin D deficiency, unspecified: Secondary | ICD-10-CM | POA: Diagnosis not present

## 2019-08-05 DIAGNOSIS — N946 Dysmenorrhea, unspecified: Secondary | ICD-10-CM | POA: Diagnosis not present

## 2019-08-19 ENCOUNTER — Ambulatory Visit: Payer: Self-pay | Admitting: Allergy

## 2019-08-26 ENCOUNTER — Other Ambulatory Visit: Payer: Self-pay

## 2019-08-26 ENCOUNTER — Other Ambulatory Visit: Payer: Self-pay | Admitting: Family Medicine

## 2019-08-26 ENCOUNTER — Encounter: Payer: Self-pay | Admitting: Family Medicine

## 2019-08-26 ENCOUNTER — Ambulatory Visit (INDEPENDENT_AMBULATORY_CARE_PROVIDER_SITE_OTHER): Payer: BC Managed Care – PPO | Admitting: Family Medicine

## 2019-08-26 VITALS — BP 112/72 | HR 72 | Temp 98.0°F | Resp 18 | Ht 66.0 in | Wt 121.6 lb

## 2019-08-26 DIAGNOSIS — B999 Unspecified infectious disease: Secondary | ICD-10-CM | POA: Diagnosis not present

## 2019-08-26 DIAGNOSIS — H1010 Acute atopic conjunctivitis, unspecified eye: Secondary | ICD-10-CM | POA: Diagnosis not present

## 2019-08-26 DIAGNOSIS — J3089 Other allergic rhinitis: Secondary | ICD-10-CM | POA: Diagnosis not present

## 2019-08-26 DIAGNOSIS — T7800XA Anaphylactic reaction due to unspecified food, initial encounter: Secondary | ICD-10-CM

## 2019-08-26 DIAGNOSIS — J302 Other seasonal allergic rhinitis: Secondary | ICD-10-CM

## 2019-08-26 MED ORDER — FLUTICASONE PROPIONATE 50 MCG/ACT NA SUSP: NASAL | 5 refills | Status: DC

## 2019-08-26 MED ORDER — PAZEO 0.7 % OP SOLN: 1.0000 [drp] | Freq: Every day | OPHTHALMIC | 5 refills | Status: DC | PRN

## 2019-08-26 NOTE — Progress Notes (Signed)
[address] [place] [address] Dept: [phone]  FAMILY NURSE PRACTITIONER NEW PATIENT NOTE  Patient ID: Rebecca Wallace, female    DOB: 12/16/1985  Age: 33 y.o. MRN: [id number] Date of Office Visit: 08/26/2019 Referring provider: No referring provider defined for this encounter.    Chief Complaint: Sinus Problem  HPI Rebecca Wallace is a 33 year old female who presents to the clinic for evaluation of chronic sinus issues and recurrent infections. She reports that she continues to experience clear rhinorrhea, sneezing, nasal congestion, and thick post nasal drainage with out seasonal variation. She reports that she had a sinus surgery in 2012 for turbinate reduction that she reports has not resolved her sinus issues. She is currently using Simply Saline nasal spray daily and cetirizine once  day. She reports that her ears feel full now, however, they typically do not bother her.She reports that she has about 5 sinus infections a year with several requiring antibiotics for resolution.  She reports one case of strep and an ear infection recently. She denies history of pneumonia. She reports a few episodes when she experience hives and red, burning feeling on her face, once after eating at a buffet and others after eating seafood. She denies concomitant angioedema and cardiopulmonary symptoms accompaing the hives. She reports the hives and facial flushing cleared in 2-3 days. She is currently limiting dietary intake of tomato and eating mostly whole foods including vegetables. She reports that 9 times out of 10 she feels nauseated after eating any foods. She has a history of small bowel bacterial overgrowth. She also has a history of Lyme disease. She reports that she had an allergic reaction to an epidural and was told to avoid shellfish. Her current medications are listed in the chart.   Review of Systems  HENT: Positive for congestion.        Clear rhinorrhea, post nasal drainage,  sneezing  Eyes: Negative.   Respiratory: Negative.   Cardiovascular: Negative.   Gastrointestinal: Positive for nausea and vomiting.  Genitourinary: Negative.   Musculoskeletal: Negative.   Skin: Positive for itching and rash.       Occasional hives and facial flushing lasting for 2-3 days    Outpatient Encounter Medications as of 08/26/2019  Medication Sig   cetirizine (ZYRTEC) 10 MG tablet Take 10 mg by mouth daily.   cyclobenzaprine (FLEXERIL) 5 MG tablet TAKE 1 TABLET BY MOUTH THREE TIMES DAILY   diazepam (VALIUM) 5 MG tablet Take 5 mg by mouth at bedtime as needed.   LORazepam (ATIVAN) 0.5 MG tablet Take 0.5 mg by mouth 2 (two) times daily as needed.   Multiple Vitamin (MULTIVITAMIN) tablet Take 1 tablet by mouth daily.   promethazine (PHENERGAN) 25 MG tablet Take 25 mg by mouth every 6 (six) hours as needed.   VELIVET 0.1/0.125/0.15 -0.025 MG tablet Take 1 tablet by mouth daily.   venlafaxine XR (EFFEXOR-XR) 37.5 MG 24 hr capsule Take 37.5 mg by mouth daily.    zolpidem (AMBIEN) 10 MG tablet Take 10 mg by mouth at bedtime.   fluticasone (FLONASE) 50 MCG/ACT nasal spray Use one to two sprays in each nostril once daily as directed for nasal congestion.   PAZEO 0.7 % SOLN Place 1 drop into both eyes daily as needed (for red, itchy eyes).   No facility-administered encounter medications on file as of 08/26/2019.      Drug Allergies: No Known Allergies  Environmental History: Rebecca Wallace lives in a house with no concern for water  or mildew damage. The flooring is carpet throughout. Heating Is electric and cooling is central; There are dogs and cats located insoide the home and in the bedroom. There are no dust mite free covers in use on the mattress or pillows. There is no concern for smoke chemicals or dust at her home or workplace. 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 She is a former smoker at 1 pack a day for 10 years. She quit smoking 4 years ago.    Family History: Rebecca Wallace's family history includes Diabetes in her father and mother; Heart Problems in her maternal grandmother; Heart attack in her maternal grandfather; High blood pressure in her maternal grandmother and mother; Sjogren's syndrome in her mother..  Physical Exam: BP 112/72    Pulse 72    Temp 98 F (36.7 C) (Temporal)    Resp 18    Ht 5\' 6"  (1.676 m)    Wt 121 lb 9.6 oz (55.2 kg)    SpO2 96%    BMI 19.63 kg/m    Physical Exam Vitals signs reviewed.  Constitutional:      Appearance: Normal appearance.  HENT:     Head: Normocephalic and atraumatic.     Right Ear: Tympanic membrane normal.     Left Ear: Tympanic membrane normal.     Nose:     Comments: Bilateral nares slightly erythematous with clear nasal drainage. Pharynx normal. Ears normal. Eyes normal.    Mouth/Throat:     Pharynx: Oropharynx is clear.  Eyes:     Conjunctiva/sclera: Conjunctivae normal.  Cardiovascular:     Pulses: Normal pulses.     Heart sounds: Normal heart sounds.  Pulmonary:     Effort: Pulmonary effort is normal.     Breath sounds: Normal breath sounds.     Comments: Lungs clear to auscultation Musculoskeletal: Normal range of motion.  Skin:    General: Skin is warm and dry.     Comments: No rashes noted  Neurological:     Mental Status: She is alert and oriented to person, place, and time.  Psychiatric:        Mood and Affect: Mood normal.        Behavior: Behavior normal.        Thought Content: Thought content normal.        Judgment: Judgment normal.    Diagnostics: Percutaneous environmental skin testing was negative with adequate controls.  Intradermal skin testing was positive to mold mix 3 and dust mite with adequate controls.   Selected foods were negative with adequate controls.   Assessment  Assessment and Plan: 1. Allergy with anaphylaxis due to food   2. Seasonal allergic conjunctivitis   3. Seasonal and perennial allergic rhinitis   4. Recurrent  infections     Meds ordered this encounter  Medications   fluticasone (FLONASE) 50 MCG/ACT nasal spray    Sig: Use one to two sprays in each nostril once daily as directed for nasal congestion.    Dispense:  16 g    Refill:  5   PAZEO 0.7 % SOLN    Sig: Place 1 drop into both eyes daily as needed (for red, itchy eyes).    Dispense:  2.5 mL    Refill:  5      Patient Instructions  Allergic rhinitis Your skin testing was positive to mold and dust mites today Avoidance measures are lsited below Continue cetirizine 10 mg once a day as  needed for a runny nose Begin Flonase 1-2 sprays in each nostril once a day as needed for a stuffy nose Consider saline nasal rinses as needed for nasal symptoms. Use this before any medicated nasal sprays for best result  Allergic conjunctivitis Begin Pazeo eye drops one drop in each eye once a day as needed for red, itchy eyes  Food allergies Your food testing was negative Continue to avoid shellfish at this time. We will place a lab order to help [place] evaluate this allergy further.  If your symptoms re-occur, begin a journal of events that occurred for up to 6 hours before your symptoms began including foods and beverages consumed, and medications.   Recurrent infections We will order some labwork today to help [place] better evaluate your recurrent infections  Call the clinic if this treatment plan is not working well for you  Follow up in 6 weeks or sooner if needed.   Return in about 6 weeks (around 10/07/2019).   Thank you for the opportunity to care for this patient.  Please do not hesitate to contact me with questions.  Thermon Leyland, FNP [facility]

## 2019-08-26 NOTE — Patient Instructions (Addendum)
Allergic rhinitis Your skin testing was positive to mold and dust mites today Avoidance measures are lsited below Continue cetirizine 10 mg once a day as needed for a runny nose Begin Flonase 1-2 sprays in each nostril once a day as needed for a stuffy nose Consider saline nasal rinses as needed for nasal symptoms. Use this before any medicated nasal sprays for best result  Allergic conjunctivitis Begin Pazeo eye drops one drop in each eye once a day as needed for red, itchy eyes  Food allergies Your food testing was negative Continue to avoid shellfish at this time. We will place a lab order to help [place] evaluate this allergy further.  If your symptoms re-occur, begin a journal of events that occurred for up to 6 hours before your symptoms began including foods and beverages consumed, and medications.   Recurrent infections We will order some labwork today to help [place] better evaluate your recurrent infections  Call the clinic if this treatment plan is not working well for you  Follow up in 6 weeks or sooner if needed.

## 2019-09-02 ENCOUNTER — Ambulatory Visit: Payer: Self-pay | Admitting: Allergy

## 2019-09-03 ENCOUNTER — Telehealth: Payer: Self-pay

## 2019-09-03 NOTE — Telephone Encounter (Signed)
Patients insurace does not cover Pazeo. Is there an alternative you want to try or attempt PA?

## 2019-09-08 NOTE — Telephone Encounter (Signed)
While it didn't look like her insurance would cover pataday either, patient was okay with purchasing it OTC. She also scheduled a 6 week follow up with Dr. Nelva Bush.

## 2019-09-08 NOTE — Telephone Encounter (Signed)
Can we try Pataday please? Thank you

## 2019-09-18 DIAGNOSIS — J018 Other acute sinusitis: Secondary | ICD-10-CM | POA: Diagnosis not present

## 2019-09-18 DIAGNOSIS — R509 Fever, unspecified: Secondary | ICD-10-CM | POA: Diagnosis not present

## 2019-09-18 LAB — CBC WITH DIFFERENTIAL/PLATELET
Basophils Absolute: 0 10*3/uL (ref 0.0–0.2)
Basos: 1 %
EOS (ABSOLUTE): 0.2 10*3/uL (ref 0.0–0.4)
Eos: 3 %
Hematocrit: 41.9 % (ref 34.0–46.6)
Hemoglobin: 14.4 g/dL (ref 11.1–15.9)
Immature Grans (Abs): 0 10*3/uL (ref 0.0–0.1)
Immature Granulocytes: 0 %
Lymphocytes Absolute: 2.4 10*3/uL (ref 0.7–3.1)
Lymphs: 37 %
MCH: 32.4 pg (ref 26.6–33.0)
MCHC: 34.4 g/dL (ref 31.5–35.7)
MCV: 94 fL (ref 79–97)
Monocytes Absolute: 0.5 10*3/uL (ref 0.1–0.9)
Monocytes: 8 %
Neutrophils Absolute: 3.2 10*3/uL (ref 1.4–7.0)
Neutrophils: 51 %
Platelets: 279 10*3/uL (ref 150–450)
RBC: 4.45 x10E6/uL (ref 3.77–5.28)
RDW: 12 % (ref 11.7–15.4)
WBC: 6.3 10*3/uL (ref 3.4–10.8)

## 2019-09-18 LAB — ALLERGEN PROFILE, SHELLFISH
Clam IgE: <0.1 kU/L
F023-IgE Crab: <0.1 kU/L
F080-IgE Lobster: <0.1 kU/L
F290-IgE Oyster: <0.1 kU/L
Scallop IgE: <0.1 kU/L
Shrimp IgE: <0.1 kU/L

## 2019-09-18 LAB — STREP PNEUMONIAE 23 SEROTYPES IGG
Pneumo Ab Type 1*: 0.7 ug/mL — ABNORMAL LOW (ref >1.3)
Pneumo Ab Type 12 (12F)*: 0.5 ug/mL — ABNORMAL LOW (ref >1.3)
Pneumo Ab Type 14*: 2.8 ug/mL (ref >1.3)
Pneumo Ab Type 17 (17F)*: 0.6 ug/mL — ABNORMAL LOW (ref >1.3)
Pneumo Ab Type 19 (19F)*: 2.2 ug/mL (ref >1.3)
Pneumo Ab Type 2*: 0.3 ug/mL — ABNORMAL LOW (ref >1.3)
Pneumo Ab Type 20*: 1.5 ug/mL (ref >1.3)
Pneumo Ab Type 22 (22F)*: <0.1 ug/mL — ABNORMAL LOW (ref >1.3)
Pneumo Ab Type 23 (23F)*: <0.1 ug/mL — ABNORMAL LOW (ref >1.3)
Pneumo Ab Type 26 (6B)*: 0.2 ug/mL — ABNORMAL LOW (ref >1.3)
Pneumo Ab Type 3*: 4.3 ug/mL (ref >1.3)
Pneumo Ab Type 34 (10A)*: 1.9 ug/mL (ref >1.3)
Pneumo Ab Type 4*: 0.1 ug/mL — ABNORMAL LOW (ref >1.3)
Pneumo Ab Type 43 (11A)*: 0.2 ug/mL — ABNORMAL LOW (ref >1.3)
Pneumo Ab Type 5*: 1.3 ug/mL — ABNORMAL LOW (ref >1.3)
Pneumo Ab Type 51 (7F)*: 0.1 ug/mL — ABNORMAL LOW (ref >1.3)
Pneumo Ab Type 54 (15B)*: <0.1 ug/mL — ABNORMAL LOW (ref >1.3)
Pneumo Ab Type 56 (18C)*: 8.6 ug/mL (ref >1.3)
Pneumo Ab Type 57 (19A)*: 4.4 ug/mL (ref >1.3)
Pneumo Ab Type 68 (9V)*: <0.1 ug/mL — ABNORMAL LOW (ref >1.3)
Pneumo Ab Type 70 (33F)*: 1.1 ug/mL — ABNORMAL LOW (ref >1.3)
Pneumo Ab Type 8*: 0.1 ug/mL — ABNORMAL LOW (ref >1.3)
Pneumo Ab Type 9 (9N)*: 0.5 ug/mL — ABNORMAL LOW (ref >1.3)

## 2019-09-18 LAB — IGG, IGA, IGM
IgA/Immunoglobulin A, Serum: 131 mg/dL (ref 87–352)
IgG (Immunoglobin G), Serum: 1028 mg/dL (ref 586–1602)
IgM (Immunoglobulin M), Srm: 99 mg/dL (ref 26–217)

## 2019-09-18 LAB — TETANUS ANTIBODY, IGG: Tetanus Ab, IgG: 2.2 IU/mL (ref <0.10)

## 2019-09-22 NOTE — Progress Notes (Signed)
Can you please call this patient and let her know that her immune screening indicates she is not well protected against pneumococcal disease. Therefore, I would recommend that she receive pneumovax immunization followed by a recheck of her pneumococcal titers 4-6 weeks after receiving the immunization. Please let her know that her CBC and tetanus titers were normal. Her shellfish skin testing and serum IgE were both negative. She should continue to avoid shellfish and continue to carry her epinephrine auto-injector at all times, and, if she is interested, she can come into the clinic for a shellfish challenge. Thank you so much

## 2019-09-23 ENCOUNTER — Other Ambulatory Visit: Payer: Self-pay

## 2019-09-23 DIAGNOSIS — B999 Unspecified infectious disease: Secondary | ICD-10-CM

## 2019-09-25 DIAGNOSIS — Z23 Encounter for immunization: Secondary | ICD-10-CM | POA: Diagnosis not present

## 2019-10-07 ENCOUNTER — Ambulatory Visit: Payer: BC Managed Care – PPO | Admitting: Allergy

## 2019-10-21 ENCOUNTER — Other Ambulatory Visit: Payer: Self-pay

## 2019-10-21 ENCOUNTER — Ambulatory Visit (INDEPENDENT_AMBULATORY_CARE_PROVIDER_SITE_OTHER): Payer: BC Managed Care – PPO | Admitting: Allergy

## 2019-10-21 ENCOUNTER — Encounter: Payer: Self-pay | Admitting: Allergy

## 2019-10-21 VITALS — BP 118/82 | HR 72 | Temp 98.7°F | Resp 16

## 2019-10-21 DIAGNOSIS — M9901 Segmental and somatic dysfunction of cervical region: Secondary | ICD-10-CM | POA: Diagnosis not present

## 2019-10-21 DIAGNOSIS — M545 Low back pain: Secondary | ICD-10-CM | POA: Diagnosis not present

## 2019-10-21 DIAGNOSIS — J3089 Other allergic rhinitis: Secondary | ICD-10-CM | POA: Diagnosis not present

## 2019-10-21 DIAGNOSIS — M9903 Segmental and somatic dysfunction of lumbar region: Secondary | ICD-10-CM | POA: Diagnosis not present

## 2019-10-21 DIAGNOSIS — B999 Unspecified infectious disease: Secondary | ICD-10-CM | POA: Diagnosis not present

## 2019-10-21 DIAGNOSIS — H1010 Acute atopic conjunctivitis, unspecified eye: Secondary | ICD-10-CM | POA: Diagnosis not present

## 2019-10-21 DIAGNOSIS — J302 Other seasonal allergic rhinitis: Secondary | ICD-10-CM

## 2019-10-21 DIAGNOSIS — T7800XA Anaphylactic reaction due to unspecified food, initial encounter: Secondary | ICD-10-CM

## 2019-10-21 DIAGNOSIS — M542 Cervicalgia: Secondary | ICD-10-CM | POA: Diagnosis not present

## 2019-10-21 NOTE — Patient Instructions (Addendum)
Allergic rhinitis - continue avoidance measures for mold and dust mites  - continue cetirizine 10 mg once a day as needed for a runny nose - continue Flonase 1-2 sprays in each nostril once a day as needed for a stuffy nose - consider saline nasal rinses as needed for nasal symptoms. Use this before any medicated nasal sprays for best result.  Rinse kit provided today  - allergen immunotherapy discussed today including protocol, benefits and risk.  Informational handout provided.  If interested in this therapuetic option you can check with your insurance carrier for coverage.  Let us know if you would like to proceed with this option.     Allergic conjunctivitis - use Pazeo (or Olopatadine 0.2% - Pataday that is already over-the-counter) eye drops one drop in each eye once a day as needed for red, itchy eyes  Food allergies  - your food testing was negative.  Serum IgE testing to shellfish also negative.  If interested in determining if no longer allergic recommend an in-office food challenge.    - continue to avoid shellfish at this time and recommend having access to an epinephrine device and following emergency action plan.   - if  your symptoms re-occur, begin a journal of events that occurred for up to 6 hours before your symptoms began including foods and beverages consumed and medications.   Recurrent infections - immunocompetence screen did reveal poor protection to the pneumococcal vaccine which you have received a booster.  Will await repeat titers to be done next week to ensure you have made an adequate response.     Follow up in 3-4 months or sooner if needed.

## 2019-10-21 NOTE — Progress Notes (Signed)
Follow-up Note  RE: Rebecca Wallace MRN: [id number] DOB: [birth year] Date of Office Visit: 10/21/2019   History of present illness: Rebecca Wallace is a 34 y.o. female presenting today for follow-up of allergic rhinitis with conjunctivitis, recurrent sinopulmonary infections as well as food allergy.  She was seen in the office on August 26, 2019 for initial evaluation by Gareth Morgan, FNP.  She had skin testing done at this visit that do reveal sensitivity to mold and dust mites.  She was recommended to take cetirizine daily and start Flonase.  She states she can tell a big difference with the combination of these 2 medications.  She states that she has not been able to get the Pazeo yet to help with any ocular symptoms.  She is interested in allergen immunotherapy. She did have poor protective titers to the pneumococcal vaccine and was advised to get the Pneumovax which she did and is set to have her repeat titers drawn next week. She continues to avoid shellfish.  Both her serum and skin testing were negative.   Review of systems: Review of Systems  Constitutional: Negative.   HENT: Positive for congestion. Negative for ear discharge, nosebleeds, sinus pain and sore throat.   Eyes: Negative.   Respiratory: Negative.   Cardiovascular: Negative.   Gastrointestinal: Negative.   Musculoskeletal: Negative.   Skin: Negative.   Neurological: Negative.     All other systems negative unless noted above in HPI  Past medical/social/surgical/family history have been reviewed and are unchanged unless specifically indicated below.  No changes  Medication List: Current Outpatient Medications  Medication Sig Dispense Refill  . cetirizine (ZYRTEC) 10 MG tablet Take 10 mg by mouth daily.    . cyclobenzaprine (FLEXERIL) 5 MG tablet TAKE 1 TABLET BY MOUTH THREE TIMES DAILY    . diazepam (VALIUM) 5 MG tablet Take 5 mg by mouth at bedtime as needed.    . fluticasone (FLONASE) 50 MCG/ACT nasal  spray Use one to two sprays in each nostril once daily as directed for nasal congestion. 16 g 5  . LORazepam (ATIVAN) 0.5 MG tablet Take 0.5 mg by mouth 2 (two) times daily as needed.    . Multiple Vitamin (MULTIVITAMIN) tablet Take 1 tablet by mouth daily.    Marland Kitchen PAZEO 0.7 % SOLN Place 1 drop into both eyes daily as needed (for red, itchy eyes). 2.5 mL 5  . promethazine (PHENERGAN) 25 MG tablet Take 25 mg by mouth every 6 (six) hours as needed.    . VELIVET 0.1/0.125/0.15 -0.025 MG tablet Take 1 tablet by mouth daily.    Marland Kitchen venlafaxine XR (EFFEXOR-XR) 37.5 MG 24 hr capsule Take 37.5 mg by mouth daily.     Marland Kitchen zolpidem (AMBIEN) 10 MG tablet Take 10 mg by mouth at bedtime.     No current facility-administered medications for this visit.     Known medication allergies: No Known Allergies   Physical examination: Blood pressure 118/82, pulse 72, temperature 98.7 F (37.1 C), temperature source Temporal, resp. rate 16, SpO2 98 %.  General: Alert, interactive, in no acute distress. HEENT: PERRLA, TMs pearly gray, turbinates moderately edematous with clear discharge right greater than left, post-pharynx non erythematous. Neck: Supple without lymphadenopathy. Lungs: Clear to auscultation without wheezing, rhonchi or rales. {no increased work of breathing. CV: Normal S1, S2 without murmurs. Abdomen: Nondistended, nontender. Skin: Warm and dry, without lesions or rashes. Extremities:  No clubbing, cyanosis or edema. Neuro:   Grossly intact.  Diagnositics/Labs: Labs:  Component     Latest Ref Rng & Units 08/26/2019  Pneumo Ab Type 1*     >1.3 ug/mL 0.7 (L)  Pneumo Ab Type 3*     >1.3 ug/mL 4.3  Pneumo Ab Type 4*     >1.3 ug/mL 0.1 (L)  Pneumo Ab Type 8*     >1.3 ug/mL 0.1 (L)  Pneumo Ab Type 9 (9N)*     >1.3 ug/mL 0.5 (L)  Pneumo Ab Type 12 (327F)*     >1.3 ug/mL 0.5 (L)  Pneumo Ab Type 14*     >1.3 ug/mL 2.8  Pneumo Ab Type 17 (38F)*     >1.3 ug/mL 0.6 (L)  Pneumo Ab Type 19  (827F)*     >1.3 ug/mL 2.2  Pneumo Ab Type 2*     >1.3 ug/mL 0.3 (L)  Pneumo Ab Type 20*     >1.3 ug/mL 1.5  Pneumo Ab Type 22 (12F)*     >1.3 ug/mL <0.1 (L)  Pneumo Ab Type 23 (27F)*     >1.3 ug/mL <0.1 (L)  Pneumo Ab Type 26 (6B)*     >1.3 ug/mL 0.2 (L)  Pneumo Ab Type 34 (10A)*     >1.3 ug/mL 1.9  Pneumo Ab Type 43 (11A)*     >1.3 ug/mL 0.2 (L)  Pneumo Ab Type 5*     >1.3 ug/mL 1.3 (L)  Pneumo Ab Type 51 (27F)*     >1.3 ug/mL 0.1 (L)  Pneumo Ab Type 54 (15B)*     >1.3 ug/mL <0.1 (L)  Pneumo Ab Type 56 (18C)*     >1.3 ug/mL 8.6  Pneumo Ab Type 57 (19A)*     >1.3 ug/mL 4.4  Pneumo Ab Type 68 (9V)*     >1.3 ug/mL <0.1 (L)  Pneumo Ab Type 70 (27F)*     >1.3 ug/mL 1.1 (L)  WBC     3.4 - 10.8 x10E3/uL 6.3  RBC     3.77 - 5.28 x10E6/uL 4.45  Hemoglobin     11.1 - 15.9 g/dL 14.4  HCT     34.0 - 46.6 % 41.9  MCV     79 - 97 fL 94  MCH     26.6 - 33.0 pg 32.4  MCHC     31.5 - 35.7 g/dL 34.4  RDW     11.7 - 15.4 % 12.0  Platelets     150 - 450 x10E3/uL 279  Neutrophils     Not Estab. % 51  Lymphs     Not Estab. % 37  Monocytes     Not Estab. % 8  Eos     Not Estab. % 3  Basos     Not Estab. % 1  NEUT#     1.4 - 7.0 x10E3/uL 3.2  Lymphocyte #     0.7 - 3.1 x10E3/uL 2.4  Monocytes Absolute     0.1 - 0.9 x10E3/uL 0.5  EOS (ABSOLUTE)     0.0 - 0.4 x10E3/uL 0.2  Basophils Absolute     0.0 - 0.2 x10E3/uL 0.0  Immature Granulocytes     Not Estab. % 0  Immature Grans (Abs)     0.0 - 0.1 x10E3/uL 0.0  Class Description Allergens      Comment  Clam IgE     Class 0 kU/L <0.10  F023-IgE Crab     Class 0 kU/L <0.10  Shrimp IgE     Class 0 kU/L <0.10  Scallop IgE  Class 0 kU/L <0.10  F290-IgE Oyster     Class 0 kU/L <0.10  F080-IgE Lobster     Class 0 kU/L <0.10  IgG (Immunoglobin G), Serum     586 - 1,602 mg/dL 1,028  IgA/Immunoglobulin A, Serum     87 - 352 mg/dL 131  IgM (Immunoglobulin M), Srm     26 - 217 mg/dL 99  Tetanus Ab, IgG     <0.10  IU/mL 2.20    Assessment and plan:   Allergic rhinitis - continue avoidance measures for mold and dust mites  - continue cetirizine 10 mg once a day as needed for a runny nose - continue Flonase 1-2 sprays in each nostril once a day as needed for a stuffy nose - consider saline nasal rinses as needed for nasal symptoms. Use this before any medicated nasal sprays for best result.  Rinse kit provided today  - allergen immunotherapy discussed today including protocol, benefits and risk.  Informational handout provided.  If interested in this therapuetic option you can check with your insurance carrier for coverage.  Let us know if you would like to proceed with this option.    Allergic conjunctivitis - use Pazeo (or Olopatadine 0.2% - Pataday that is already over-the-counter) eye drops one drop in each eye once a day as needed for red, itchy eyes  Anaphylaxis due to food  - your food testing was negative.  Serum IgE testing to shellfish also negative.  If interested in determining if no longer allergic recommend an in-office food challenge.    - continue to avoid shellfish at this time and recommend having access to an epinephrine device and following emergency action plan.   - if  your symptoms re-occur, begin a journal of events that occurred for up to 6 hours before your symptoms began including foods and beverages consumed and medications.   Recurrent infections - immunocompetence screen did reveal poor protection to the pneumococcal vaccine which you have received a booster.  Will await repeat titers to be done next week to ensure you have made an adequate response.     Follow up in 3-4 months or sooner if needed. I appreciate the opportunity to take part in Rebecca Wallace's care. Please do not hesitate to contact me with questions.  Sincerely,   Prudy Feeler, MD Allergy/Immunology [facility]

## 2019-10-30 DIAGNOSIS — M9901 Segmental and somatic dysfunction of cervical region: Secondary | ICD-10-CM | POA: Diagnosis not present

## 2019-10-30 DIAGNOSIS — M545 Low back pain: Secondary | ICD-10-CM | POA: Diagnosis not present

## 2019-10-30 DIAGNOSIS — M542 Cervicalgia: Secondary | ICD-10-CM | POA: Diagnosis not present

## 2019-10-30 DIAGNOSIS — M9903 Segmental and somatic dysfunction of lumbar region: Secondary | ICD-10-CM | POA: Diagnosis not present

## 2019-11-04 DIAGNOSIS — M5416 Radiculopathy, lumbar region: Secondary | ICD-10-CM | POA: Diagnosis not present

## 2019-11-04 DIAGNOSIS — B999 Unspecified infectious disease: Secondary | ICD-10-CM | POA: Diagnosis not present

## 2019-11-11 LAB — STREP PNEUMONIAE 23 SEROTYPES IGG
Pneumo Ab Type 1*: >13.1 ug/mL (ref >1.3)
Pneumo Ab Type 12 (12F)*: 2.6 ug/mL (ref >1.3)
Pneumo Ab Type 14*: 28.7 ug/mL (ref >1.3)
Pneumo Ab Type 17 (17F)*: 17.9 ug/mL (ref >1.3)
Pneumo Ab Type 19 (19F)*: 10.8 ug/mL (ref >1.3)
Pneumo Ab Type 2*: 26.2 ug/mL (ref >1.3)
Pneumo Ab Type 20*: 21.7 ug/mL (ref >1.3)
Pneumo Ab Type 22 (22F)*: 4.3 ug/mL (ref >1.3)
Pneumo Ab Type 23 (23F)*: 1.2 ug/mL — ABNORMAL LOW (ref >1.3)
Pneumo Ab Type 26 (6B)*: 5.6 ug/mL (ref >1.3)
Pneumo Ab Type 3*: 18.7 ug/mL (ref >1.3)
Pneumo Ab Type 34 (10A)*: 14.2 ug/mL (ref >1.3)
Pneumo Ab Type 4*: 1.8 ug/mL (ref >1.3)
Pneumo Ab Type 43 (11A)*: 1.5 ug/mL (ref >1.3)
Pneumo Ab Type 5*: 10.6 ug/mL (ref >1.3)
Pneumo Ab Type 51 (7F)*: 2.2 ug/mL (ref >1.3)
Pneumo Ab Type 54 (15B)*: 1.4 ug/mL (ref >1.3)
Pneumo Ab Type 56 (18C)*: >16.7 ug/mL (ref >1.3)
Pneumo Ab Type 57 (19A)*: 11.3 ug/mL (ref >1.3)
Pneumo Ab Type 68 (9V)*: 10.3 ug/mL (ref >1.3)
Pneumo Ab Type 70 (33F)*: >14.4 ug/mL (ref >1.3)
Pneumo Ab Type 8*: >25.3 ug/mL (ref >1.3)
Pneumo Ab Type 9 (9N)*: 9 ug/mL (ref >1.3)

## 2019-11-11 NOTE — Progress Notes (Signed)
Can you please call this patient and let her know that she had an excellent response to her pneumovax. Thank you

## 2019-11-19 DIAGNOSIS — M5416 Radiculopathy, lumbar region: Secondary | ICD-10-CM | POA: Diagnosis not present

## 2019-11-25 ENCOUNTER — Other Ambulatory Visit: Payer: Self-pay | Admitting: Family Medicine

## 2019-12-18 ENCOUNTER — Other Ambulatory Visit: Payer: Self-pay | Admitting: Family Medicine

## 2019-12-29 ENCOUNTER — Other Ambulatory Visit: Payer: Self-pay | Admitting: Family Medicine

## 2020-01-17 ENCOUNTER — Other Ambulatory Visit: Payer: Self-pay | Admitting: Family Medicine

## 2020-01-31 ENCOUNTER — Other Ambulatory Visit: Payer: Self-pay | Admitting: Family Medicine

## 2020-02-04 DIAGNOSIS — M9903 Segmental and somatic dysfunction of lumbar region: Secondary | ICD-10-CM | POA: Diagnosis not present

## 2020-02-04 DIAGNOSIS — M545 Low back pain: Secondary | ICD-10-CM | POA: Diagnosis not present

## 2020-02-04 DIAGNOSIS — M542 Cervicalgia: Secondary | ICD-10-CM | POA: Diagnosis not present

## 2020-02-04 DIAGNOSIS — M9901 Segmental and somatic dysfunction of cervical region: Secondary | ICD-10-CM | POA: Diagnosis not present

## 2020-03-04 ENCOUNTER — Other Ambulatory Visit: Payer: Self-pay | Admitting: Family Medicine

## 2020-03-15 ENCOUNTER — Encounter: Payer: Self-pay | Admitting: Physician Assistant

## 2020-03-15 ENCOUNTER — Telehealth (INDEPENDENT_AMBULATORY_CARE_PROVIDER_SITE_OTHER): Payer: BC Managed Care – PPO | Admitting: Physician Assistant

## 2020-03-15 ENCOUNTER — Telehealth: Payer: BC Managed Care – PPO | Admitting: Family Medicine

## 2020-03-15 VITALS — Temp 99.5°F | Ht 65.0 in | Wt 122.0 lb

## 2020-03-15 DIAGNOSIS — H9209 Otalgia, unspecified ear: Secondary | ICD-10-CM | POA: Insufficient documentation

## 2020-03-15 DIAGNOSIS — R5381 Other malaise: Secondary | ICD-10-CM

## 2020-03-15 NOTE — Progress Notes (Signed)
Virtual Visit via Telephone Note   This visit type was conducted due to national recommendations for restrictions regarding the COVID-19 Pandemic (e.g. social distancing) in an effort to limit this patient's exposure and mitigate transmission in our community.  Due to her co-morbid illnesses, this patient is at least at moderate risk for complications without adequate follow up.  This format is felt to be most appropriate for this patient at this time.  The patient did not have access to video technology/had technical difficulties with video requiring transitioning to audio format only (telephone).  All issues noted in this document were discussed and addressed.  No physical exam could be performed with this format.  Patient verbally consented to a telehealth visit.   Date:  03/15/2020   ID:  Rebecca Wallace, DOB 02-09-1986, MRN [id number]  Patient Location: Home Provider Location: Office  PCP:  Blane Ohara, MD     Chief Complaint:  COVID exposure  History of Present Illness:    Rebecca Wallace is a 34 y.o. female with COVID exposure Pt states that since Friday she has had fatigue, body aches, and headache.  She ran a fever with Tylenol of up to 100.5 - she has had mild sore throat and mild cough.  Has been exposed to positive strep and positive COVID - she is currently using Mucinex DM for her symptoms  The patient does have symptoms concerning for COVID-19 infection (fever, chills, cough, or new shortness of breath).    Past Medical History:  Diagnosis Date  . Allergy to other foods   . 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 GERD (gastroesophageal reflux disease)   . History of cervical cancer   . Idiopathic hypotension   . Insomnia due to other mental disorder   . Lyme disease   . Major depressive disorder, recurrent, moderate (HCC)   . Migraine without aura, not intractable, without status migrainosus   . Other dorsalgia   . Other idiopathic scoliosis, lumbar region   .  Premenstrual tension syndrome   . Small intestinal bacterial overgrowth   . Xerosis cutis    Past Surgical History:  Procedure Laterality Date  . ANAL FISSURE REPAIR  01/11/2019  . BREAST ENHANCEMENT SURGERY  08/18/2009  . CHOLECYSTECTOMY  2018  . GANGLION CYST EXCISION     Left hand 2005  . KNEE SURGERY Left 12/2017  . SINOSCOPY  09/20/2011  . TONSILLECTOMY  10/2007     Current Meds  Medication Sig  . celecoxib (CELEBREX) 200 MG capsule Take 1 capsule by mouth twice daily  . cetirizine (ZYRTEC) 10 MG tablet Take 10 mg by mouth daily.  . cyclobenzaprine (FLEXERIL) 5 MG tablet TAKE 1 TABLET BY MOUTH THREE TIMES DAILY  . diazepam (VALIUM) 5 MG tablet TAKE 1 TABLET BY MOUTH AT BEDTIME AS NEEDED FOR BACK SPASMS  . fluticasone (FLONASE) 50 MCG/ACT nasal spray Use one to two sprays in each nostril once daily as directed for nasal congestion.  Marland Kitchen LORazepam (ATIVAN) 0.5 MG tablet Take 1 tablet by mouth twice daily as needed for anxiety  . Magnesium Oxide 500 MG TABS Take 4 tablets by mouth at bedtime.  . Multiple Vitamin (MULTIVITAMIN) tablet Take 1 tablet by mouth daily.  . promethazine (PHENERGAN) 25 MG tablet TAKE 1 TABLET BY MOUTH EVERY 6 HOURS AS NEEDED FOR NAUSEA AND VOMITING  . VELIVET 0.1/0.125/0.15 -0.025 MG tablet Take 1 tablet by mouth daily.  Marland Kitchen venlafaxine XR (EFFEXOR-XR) 37.5 MG 24 hr capsule Take  2 capsules by mouth once daily  . zolpidem (AMBIEN) 10 MG tablet TAKE 1 TABLET BY MOUTH ONCE DAILY AT BEDTIME     Allergies:   Patient has no known allergies.   Social History   Tobacco Use  . Smoking status: Former Smoker    Packs/day: 1.00    Years: 10.00    Pack years: 10.00    Types: Cigarettes    Quit date: 2016    Years since quitting: 5.4  . Smokeless tobacco: Never Used  Substance Use Topics  . Alcohol use: Yes    Comment: Seldom  . Drug use: Never     Family Hx: The patient's family history includes Anxiety disorder in her mother; COPD in an other family  member; Depression in her mother; Diabetes in her father and mother; Heart Problems in her maternal grandmother; Heart attack in her maternal grandfather; High blood pressure in her maternal grandmother and mother; Sjogren's syndrome in her mother.  ROS:   Please see the history of present illness.    All other systems reviewed and are negative.  Labs/Other Tests and Data Reviewed:    Recent Labs: 08/26/2019: Hemoglobin 14.4; Platelets 279   Recent Lipid Panel No results found for: CHOL, TRIG, HDL, CHOLHDL, LDLCALC, LDLDIRECT  Wt Readings from Last 3 Encounters:  03/15/20 122 lb (55.3 kg)  08/26/19 121 lb 9.6 oz (55.2 kg)     Objective:    Vital Signs:  Temp 99.5 F (37.5 C) (Temporal)   Ht 5\' 5"  (1.651 m)   Wt 122 lb (55.3 kg)   BMI 20.30 kg/m    VITAL SIGNS:  reviewed  ASSESSMENT & PLAN:    1. Malaise - COVID exposure --- recommend to come in for COVID test and strep test - continue to use tylenol and mucinex DM as directed  COVID-19 Education: The signs and symptoms of COVID-19 were discussed with the patient and how to seek care for testing (follow up with PCP or arrange E-visit). The importance of social distancing was discussed today.  Time:   Today, I have spent 10 minutes with the patient with telehealth technology discussing the above problems.     Medication Adjustments/Labs and Tests Ordered: Current medicines are reviewed at length with the patient today.  Concerns regarding medicines are outlined above.   Tests Ordered: Orders Placed This Encounter  Procedures  . HM COLONOSCOPY    Medication Changes: No orders of the defined types were placed in this encounter.   Follow Up:  In Person prn - strep test negative - covid test sent out - continue to treat symptoms  Signed, Webb Silversmith, PA-C  03/15/2020 2:44 PM    [place]

## 2020-03-16 ENCOUNTER — Ambulatory Visit: Payer: BC Managed Care – PPO

## 2020-03-16 ENCOUNTER — Other Ambulatory Visit: Payer: Self-pay

## 2020-03-16 DIAGNOSIS — I10 Essential (primary) hypertension: Secondary | ICD-10-CM

## 2020-03-16 DIAGNOSIS — R5381 Other malaise: Secondary | ICD-10-CM | POA: Diagnosis not present

## 2020-03-16 LAB — POCT RAPID STREP A (OFFICE): Rapid Strep A Screen: NEGATIVE

## 2020-03-16 NOTE — Progress Notes (Deleted)
Patient came today for a Covid Test and Strep test outside.

## 2020-03-17 LAB — SARS-COV-2, NAA 2 DAY TAT

## 2020-03-17 LAB — NOVEL CORONAVIRUS, NAA: SARS-CoV-2, NAA: DETECTED — AB

## 2020-03-19 ENCOUNTER — Other Ambulatory Visit: Payer: Self-pay

## 2020-03-31 ENCOUNTER — Other Ambulatory Visit: Payer: Self-pay | Admitting: Family Medicine

## 2020-03-31 ENCOUNTER — Ambulatory Visit: Payer: BC Managed Care – PPO | Admitting: Legal Medicine

## 2020-03-31 ENCOUNTER — Other Ambulatory Visit: Payer: Self-pay

## 2020-03-31 ENCOUNTER — Encounter: Payer: Self-pay | Admitting: Legal Medicine

## 2020-03-31 VITALS — BP 110/70 | HR 87 | Temp 98.0°F | Resp 16 | Ht 66.0 in | Wt 131.2 lb

## 2020-03-31 DIAGNOSIS — U071 COVID-19: Secondary | ICD-10-CM | POA: Diagnosis not present

## 2020-03-31 DIAGNOSIS — Z8616 Personal history of COVID-19: Secondary | ICD-10-CM | POA: Diagnosis not present

## 2020-03-31 MED ORDER — CLARITHROMYCIN 500 MG PO TABS: 500.0000 mg | ORAL_TABLET | Freq: Two times a day (BID) | ORAL | 0 refills | Status: DC

## 2020-03-31 NOTE — Progress Notes (Signed)
Acute Office Visit  Subjective:    Patient ID: Rebecca Wallace, female    DOB: January 02, 1986, 34 y.o.   MRN: [id number]  Chief Complaint  Patient presents with  . Cough  . Chest Pain    HPI Patient is in today for patient tests positive for COVID on 6//2021.  She is having continued cough and dyspnea.  Still fatigue and headaches.  Past Medical History:  Diagnosis Date  . Allergy to other foods   . 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 GERD (gastroesophageal reflux disease)   . History of cervical cancer   . Idiopathic hypotension   . Insomnia due to other mental disorder   . Lyme disease   . Major depressive disorder, recurrent, moderate ([place])   . Migraine without aura, not intractable, without status migrainosus   . Other idiopathic scoliosis, lumbar region   . Premenstrual tension syndrome   . Small intestinal bacterial overgrowth   . Xerosis cutis     Past Surgical History:  Procedure Laterality Date  . ANAL FISSURE REPAIR  01/11/2019  . BREAST ENHANCEMENT SURGERY  08/18/2009  . CHOLECYSTECTOMY  2018  . GANGLION CYST EXCISION     Left hand 2005  . KNEE SURGERY Left 12/2017  . SINOSCOPY  09/20/2011  . TONSILLECTOMY  10/2007    Family History  Problem Relation Age of Onset  . Diabetes Mother   . Sjogren's syndrome Mother   . High blood pressure Mother   . Anxiety disorder Mother   . Depression Mother   . Diabetes Father   . High blood pressure Maternal Grandmother   . Heart Problems Maternal Grandmother   . Heart attack Maternal Grandfather   . COPD Other     Social History   Socioeconomic History  . Marital status: Single    Spouse name: Not on file  . Number of children: Not on file  . Years of education: Not on file  . Highest education level: Not on file  Occupational History  . Occupation: Clinical cytogeneticist  Tobacco Use  . Smoking status: Former Smoker    Packs/day: 1.00    Years: 10.00    Pack years: 10.00    Types: Cigarettes    Quit  date: 2016    Years since quitting: 5.4  . Smokeless tobacco: Never Used  Vaping Use  . Vaping Use: Every day  . Devices: E-cig  Substance and Sexual Activity  . Alcohol use: Yes    Comment: Seldom  . Drug use: Never  . Sexual activity: Not on file  Other Topics Concern  . Not on file  Social History Narrative  . Not on file   Social Determinants of Health   Financial Resource Strain:   . Difficulty of Paying Living Expenses:   Food Insecurity:   . Worried About Charity fundraiser in the Last Year:   . Arboriculturist in the Last Year:   Transportation Needs:   . Film/video editor (Medical):   Marland Kitchen Lack of Transportation (Non-Medical):   Physical Activity:   . Days of Exercise per Week:   . Minutes of Exercise per Session:   Stress:   . Feeling of Stress :   Social Connections:   . Frequency of Communication with Friends and Family:   . Frequency of Social Gatherings with Friends and Family:   . Attends Religious Services:   . Active Member of Clubs or Organizations:   . Attends  Club or Organization Meetings:   Marland Kitchen Marital Status:   Intimate Partner Violence:   . Fear of Current or Ex-Partner:   . Emotionally Abused:   Marland Kitchen Physically Abused:   . Sexually Abused:     Outpatient Medications Prior to Visit  Medication Sig Dispense Refill  . celecoxib (CELEBREX) 200 MG capsule Take 1 capsule by mouth twice daily 60 capsule 0  . cetirizine (ZYRTEC) 10 MG tablet Take 10 mg by mouth daily.    . cyclobenzaprine (FLEXERIL) 5 MG tablet TAKE 1 TABLET BY MOUTH THREE TIMES DAILY 270 tablet 0  . diazepam (VALIUM) 5 MG tablet TAKE 1 TABLET BY MOUTH AT BEDTIME AS NEEDED FOR BACK SPASMS 15 tablet 0  . fluticasone (FLONASE) 50 MCG/ACT nasal spray Use one to two sprays in each nostril once daily as directed for nasal congestion. 16 g 5  . LORazepam (ATIVAN) 0.5 MG tablet Take 1 tablet by mouth twice daily as needed for anxiety 60 tablet 0  . Magnesium Oxide 500 MG TABS Take 4 tablets  by mouth at bedtime.    . Multiple Vitamin (MULTIVITAMIN) tablet Take 1 tablet by mouth daily.    . promethazine (PHENERGAN) 25 MG tablet TAKE 1 TABLET BY MOUTH EVERY 6 HOURS AS NEEDED FOR NAUSEA AND VOMITING 40 tablet 0  . VELIVET 0.1/0.125/0.15 -0.025 MG tablet Take 1 tablet by mouth daily.    Marland Kitchen zolpidem (AMBIEN) 10 MG tablet TAKE 1 TABLET BY MOUTH ONCE DAILY AT BEDTIME 30 tablet 2  . venlafaxine XR (EFFEXOR-XR) 37.5 MG 24 hr capsule Take 2 capsules by mouth once daily 180 capsule 0   No facility-administered medications prior to visit.    No Known Allergies  Review of Systems  Constitutional: Positive for fatigue.  HENT: Positive for congestion.   Eyes: Negative.   Respiratory: Positive for cough and shortness of breath.   Cardiovascular: Negative.   Gastrointestinal: Negative.   Genitourinary: Negative.   Musculoskeletal: Negative.   Skin: Negative.   Neurological: Negative.        Objective:    Physical Exam Vitals reviewed.  Constitutional:      Appearance: She is well-developed and normal weight.  HENT:     Head: Normocephalic and atraumatic.     Right Ear: Tympanic membrane, ear canal and external ear normal.     Left Ear: Tympanic membrane, ear canal and external ear normal.     Nose: Nose normal.     Mouth/Throat:     Mouth: Mucous membranes are moist.  Eyes:     Extraocular Movements: Extraocular movements intact.     Pupils: Pupils are equal, round, and reactive to light.  Cardiovascular:     Rate and Rhythm: Normal rate and regular rhythm.     Heart sounds: Normal heart sounds.  Pulmonary:     Effort: Pulmonary effort is normal.     Breath sounds: Normal breath sounds.  Musculoskeletal:     Cervical back: Normal range of motion and neck supple.  Neurological:     Mental Status: She is alert.     BP 110/70   Pulse 87   Temp 98 F (36.7 C)   Resp 16   Ht '5\' 6"'  (1.676 m)   Wt 131 lb 3.2 oz (59.5 kg)   SpO2 97%   BMI 21.18 kg/m  Wt Readings  from Last 3 Encounters:  03/31/20 131 lb 3.2 oz (59.5 kg)  03/15/20 122 lb (55.3 kg)  08/26/19 121 lb 9.6 oz (  55.2 kg)    Health Maintenance Due  Topic Date Due  . Hepatitis C Screening  Never done  . COVID-19 Vaccine (1) Never done  . HIV Screening  Never done  . TETANUS/TDAP  Never done  . PAP SMEAR-Modifier  Never done    There are no preventive care reminders to display for this patient.   No results found for: TSH Lab Results  Component Value Date   WBC 6.3 08/26/2019   HGB 14.4 08/26/2019   HCT 41.9 08/26/2019   MCV 94 08/26/2019   PLT 279 08/26/2019   No results found for: NA, K, CHLORIDE, CO2, GLUCOSE, BUN, CREATININE, BILITOT, ALKPHOS, AST, ALT, PROT, ALBUMIN, CALCIUM, ANIONGAP, EGFR, GFR No results found for: CHOL No results found for: HDL No results found for: LDLCALC No results found for: TRIG No results found for: CHOLHDL No results found for: HGBA1C     Assessment & Plan:  1. COVID-19 - clarithromycin (BIAXIN) 500 MG tablet; Take 1 tablet (500 mg total) by mouth 2 (two) times daily.  Dispense: 20 tablet; Refill: 0 - DG Chest 2 View  Patient has acute COVID and continues to have dyspnea and headache.  I ordered chest x-ray.  Loa until next wednesay  Meds ordered this encounter  Medications  . clarithromycin (BIAXIN) 500 MG tablet    Sig: Take 1 tablet (500 mg total) by mouth 2 (two) times daily.    Dispense:  20 tablet    Refill:  0    Orders Placed This Encounter  Procedures  . DG Chest 2 View     Follow-up: Return in 1 week (on 04/07/2020).  An After Visit Summary was printed and given to the patient.  [place] [phone]

## 2020-04-18 ENCOUNTER — Other Ambulatory Visit: Payer: Self-pay | Admitting: Family Medicine

## 2020-04-20 ENCOUNTER — Other Ambulatory Visit: Payer: Self-pay | Admitting: Physician Assistant

## 2020-04-27 ENCOUNTER — Other Ambulatory Visit: Payer: Self-pay | Admitting: Family Medicine

## 2020-06-02 DIAGNOSIS — M542 Cervicalgia: Secondary | ICD-10-CM | POA: Diagnosis not present

## 2020-06-02 DIAGNOSIS — M9903 Segmental and somatic dysfunction of lumbar region: Secondary | ICD-10-CM | POA: Diagnosis not present

## 2020-06-02 DIAGNOSIS — M545 Low back pain: Secondary | ICD-10-CM | POA: Diagnosis not present

## 2020-06-02 DIAGNOSIS — M9901 Segmental and somatic dysfunction of cervical region: Secondary | ICD-10-CM | POA: Diagnosis not present

## 2020-06-05 ENCOUNTER — Other Ambulatory Visit: Payer: Self-pay | Admitting: Physician Assistant

## 2020-06-09 ENCOUNTER — Other Ambulatory Visit: Payer: Self-pay | Admitting: Physician Assistant

## 2020-06-24 DIAGNOSIS — Z01419 Encounter for gynecological examination (general) (routine) without abnormal findings: Secondary | ICD-10-CM | POA: Diagnosis not present

## 2020-07-12 ENCOUNTER — Encounter: Payer: Self-pay | Admitting: Family Medicine

## 2020-07-12 ENCOUNTER — Telehealth (INDEPENDENT_AMBULATORY_CARE_PROVIDER_SITE_OTHER): Payer: BC Managed Care – PPO | Admitting: Family Medicine

## 2020-07-12 DIAGNOSIS — J329 Chronic sinusitis, unspecified: Secondary | ICD-10-CM | POA: Insufficient documentation

## 2020-07-12 MED ORDER — AMOXICILLIN 875 MG PO TABS: 875.0000 mg | ORAL_TABLET | Freq: Two times a day (BID) | ORAL | 0 refills | Status: DC

## 2020-07-12 NOTE — Progress Notes (Signed)
Virtual Visit via Telephone Note  I connected with Rebecca Wallace on 07/12/20 at  1:30 PM EDT by telephone and verified that I am speaking with the correct person using two identifiers.  Location: Patient: home Provider: clinic   I discussed the limitations, risks, security and privacy concerns of performing an evaluation and management service by telephone and the availability of in person appointments. I also discussed with the patient that there may be a patient responsible charge related to this service. The patient expressed understanding and agreed to proceed.   History of Present Illness: Last week sinus congestion-mucous-clear, hoarseness Ear pain bilat-teeth hurt, facial pressure Cough-slightly, sneezing worse Staying hoarse throughout the day On couch trying to rest NO diarrhea Taking otc sinus medication, mucinex, flonase Body aches-taking tylenol No acute illness COVID vaccine-not taken Pt had COVID in June Observations/Objective: Temp 99.3-99.9 Pulse 80 Assessment and Plan: 1. Sinusitis, unspecified chronicity, unspecified location Amoxil-rx mucinex Sudafed during the day and flonase at night Hold zyrtec Follow Up Instructions: if no improvement testing for COVID-advised COVID vaccine Work note 10/4-10/6   I discussed the assessment and treatment plan with the patient. The patient was provided an opportunity to ask questions and all were answered. The patient agreed with the plan and demonstrated an understanding of the instructions.   The patient was advised to call back or seek an in-person evaluation if the symptoms worsen or if the condition fails to improve as anticipated.  I provided 7 minutes of non-face-to-face time during this encounter.   Macaria Bias Mat Carne, MD

## 2020-07-23 ENCOUNTER — Other Ambulatory Visit: Payer: Self-pay | Admitting: Physician Assistant

## 2020-07-29 ENCOUNTER — Telehealth: Payer: Self-pay

## 2020-07-29 NOTE — Telephone Encounter (Signed)
LMTC. Concerned about possible other causes of insomnia, such as manic symptoms. Recommend call Friday am. Please review which sleep meds she tried previously (I remember trazodone, belsomra, and ambien. Maybe lunesta?) kc

## 2020-07-29 NOTE — Telephone Encounter (Signed)
Pt called sts she has not been able to sleep in days, she sts that her Remus Loffler is not working, wanted to know if you could give her something different.

## 2020-07-30 ENCOUNTER — Other Ambulatory Visit: Payer: Self-pay | Admitting: Family Medicine

## 2020-07-30 MED ORDER — ESZOPICLONE 3 MG PO TABS: 3.0000 mg | ORAL_TABLET | Freq: Every day | ORAL | 2 refills | Status: DC

## 2020-07-30 NOTE — Telephone Encounter (Signed)
Sent lunesta 3 mg once daily at night.  Stop ambien. Kc

## 2020-07-30 NOTE — Telephone Encounter (Signed)
Spoke with pt she sts she don't think that she has tried [place], and she said that her depression and anxiety are doing great. She can fall to sleep she is just having trouble staying a sleep. She said she is ok with trying something new or just upping her Ambien.

## 2020-07-30 NOTE — Telephone Encounter (Signed)
Patient was informed.

## 2020-09-09 ENCOUNTER — Other Ambulatory Visit: Payer: Self-pay | Admitting: Family Medicine

## 2020-09-24 ENCOUNTER — Other Ambulatory Visit: Payer: Self-pay | Admitting: Family Medicine

## 2020-10-04 ENCOUNTER — Encounter: Payer: Self-pay | Admitting: Nurse Practitioner

## 2020-10-04 ENCOUNTER — Telehealth (INDEPENDENT_AMBULATORY_CARE_PROVIDER_SITE_OTHER): Payer: BC Managed Care – PPO | Admitting: Nurse Practitioner

## 2020-10-04 VITALS — Temp 98.5°F | Ht 66.0 in | Wt 131.0 lb

## 2020-10-04 DIAGNOSIS — Z8619 Personal history of other infectious and parasitic diseases: Secondary | ICD-10-CM

## 2020-10-04 DIAGNOSIS — J329 Chronic sinusitis, unspecified: Secondary | ICD-10-CM

## 2020-10-04 MED ORDER — AMOXICILLIN-POT CLAVULANATE 875-125 MG PO TABS: 1.0000 | ORAL_TABLET | Freq: Two times a day (BID) | ORAL | 0 refills | Status: DC

## 2020-10-04 MED ORDER — FLUCONAZOLE 150 MG PO TABS: 150.0000 mg | ORAL_TABLET | Freq: Once | ORAL | 0 refills | Status: AC

## 2020-10-04 NOTE — Progress Notes (Signed)
Virtual Visit via Telephone Note   This visit type was conducted due to national recommendations for restrictions regarding the COVID-19 Pandemic (e.g. social distancing) in an effort to limit this patient's exposure and mitigate transmission in our community.  Due to her co-morbid illnesses, this patient is at least at moderate risk for complications without adequate follow up.  This format is felt to be most appropriate for this patient at this time.  The patient did not have access to video technology/had technical difficulties with video requiring transitioning to audio format only (telephone).  All issues noted in this document were discussed and addressed.  No physical exam could be performed with this format.  Patient verbally consented to a telehealth visit.   Date:  10/04/2020   ID:  [name], DOB 19-May-1986, MRN [id number]  Patient Location: Home Provider Location: Office/Clinic  PCP:  Blane Ohara, MD   Evaluation Performed:  Established patient, acute telemedicine visit  Chief Complaint: Sinus pressure/pain  History of Present Illness:    Rebecca Wallace is a 34 y.o. female with 4-day onset of sinus congestion, pressure, and pain. Additional symptoms include bilateral ear pain, right greater than left, post-nasal-drip, sore throat. She denies fever, rash, nausea, or vomiting. Current treatment includes Alka-seltzer Plus Day/Night formula,Zyrtec, and Flonase. She has no known ill-contacts. She has not received COVID-19 or flu immunizations. She tells me she had COVID-19 infection in June 2021. Rebecca Wallace states she has chronic allergic rhinitis and had sinus surgery in 2012 to alleviate symptoms. Other medical history includes arthritis, migraines, depression, former cigarette smoker, and current use of nicotine vapes.   The patient does have symptoms concerning for COVID-19 infection (fever, chills, cough, or new shortness of breath).    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)    GERD (gastroesophageal reflux disease)    History of cervical cancer    Idiopathic hypotension    Insomnia due to other mental disorder    Lyme disease    Major depressive disorder, recurrent, moderate (HCC)    Migraine without aura, not intractable, without status migrainosus    Other idiopathic scoliosis, lumbar region    Premenstrual tension syndrome    Small intestinal bacterial overgrowth    Xerosis cutis     Past Surgical History:  Procedure Laterality Date   ANAL FISSURE REPAIR  01/11/2019   BREAST ENHANCEMENT SURGERY  08/18/2009   CHOLECYSTECTOMY  2018   GANGLION CYST EXCISION     Left hand 2005   KNEE SURGERY Left 12/2017   SINOSCOPY  09/20/2011   TONSILLECTOMY  10/2007    Family History  Problem Relation Age of Onset   Diabetes Mother    Sjogren's syndrome Mother    High blood pressure Mother    Anxiety disorder Mother    Depression Mother    Diabetes Father    High blood pressure Maternal Grandmother    Heart Problems Maternal Grandmother    Heart attack Maternal Grandfather    COPD Other     Social History   Socioeconomic History   Marital status: Single    Spouse name: Not on file   Number of children: Not on file   Years of education: Not on file   Highest education level: Not on file  Occupational History   Occupation: book Biomedical engineer  Tobacco Use   Smoking status: Former Smoker    Packs/day: 1.00  Years: 10.00    Pack years: 10.00    Types: Cigarettes    Quit date: 2016    Years since quitting: 5.9   Smokeless tobacco: Never Used  Building services engineer Use: Every day   Devices: E-cig  Substance and Sexual Activity   Alcohol use: Yes    Comment: Seldom   Drug use: Never   Sexual activity: Not on file  Other Topics Concern   Not on file  Social History Narrative   Not on file   Social Determinants of Health    Financial Resource Strain: Not on file  Food Insecurity: Not on file  Transportation Needs: Not on file  Physical Activity: Not on file  Stress: Not on file  Social Connections: Not on file  Intimate Partner Violence: Not on file    Outpatient Medications Prior to Visit  Medication Sig Dispense Refill   celecoxib (CELEBREX) 200 MG capsule Take 1 capsule by mouth twice daily 60 capsule 0   cetirizine (ZYRTEC) 10 MG tablet Take 10 mg by mouth daily.     cyclobenzaprine (FLEXERIL) 5 MG tablet TAKE 1 TABLET BY MOUTH THREE TIMES DAILY 270 tablet 2   diazepam (VALIUM) 5 MG tablet TAKE 1 TABLET BY MOUTH AT BEDTIME AS NEEDED FOR BACK SPASMS 15 tablet 3   Eszopiclone 3 MG TABS Take 1 tablet (3 mg total) by mouth at bedtime. Take immediately before bedtime. Stop ambien. 30 tablet 2   fluticasone (FLONASE) 50 MCG/ACT nasal spray Use one to two sprays in each nostril once daily as directed for nasal congestion. 16 g 5   LORazepam (ATIVAN) 0.5 MG tablet Take 1 tablet by mouth twice daily as needed for anxiety 60 tablet 2   Magnesium Oxide 500 MG TABS Take 4 tablets by mouth at bedtime.     Multiple Vitamin (MULTIVITAMIN) tablet Take 1 tablet by mouth daily.     promethazine (PHENERGAN) 25 MG tablet TAKE 1 TABLET BY MOUTH EVERY 6 HOURS AS NEEDED FOR NAUSEA AND VOMITING 40 tablet 0   VELIVET 0.1/0.125/0.15 -0.025 MG tablet Take 1 tablet by mouth daily.     venlafaxine XR (EFFEXOR-XR) 37.5 MG 24 hr capsule Take 2 capsules by mouth once daily 180 capsule 0   amoxicillin (AMOXIL) 875 MG tablet Take 1 tablet (875 mg total) by mouth 2 (two) times daily. 20 tablet 0   clarithromycin (BIAXIN) 500 MG tablet Take 1 tablet (500 mg total) by mouth 2 (two) times daily. 20 tablet 0   No facility-administered medications prior to visit.    Allergies:   Patient has no known allergies.   Social History   Tobacco Use   Smoking status: Former Smoker    Packs/day: 1.00    Years: 10.00    Pack  years: 10.00    Types: Cigarettes    Quit date: 2016    Years since quitting: 5.9   Smokeless tobacco: Never Used  Building services engineer Use: Every day   Devices: E-cig  Substance Use Topics   Alcohol use: Yes    Comment: Seldom   Drug use: Never     Review of Systems  Constitutional: Negative for chills, fever and malaise/fatigue.  HENT: Positive for congestion, ear pain, sinus pain and sore throat.   Respiratory: Negative for cough, sputum production, shortness of breath and wheezing.   Cardiovascular: Negative for chest pain.  Gastrointestinal: Negative for abdominal pain, constipation, diarrhea, heartburn, nausea and vomiting.  Genitourinary: Negative for  frequency and urgency.  Musculoskeletal: Negative for back pain, joint pain and myalgias.  Neurological: Positive for headaches. Negative for dizziness.     Labs/Other Tests and Data Reviewed:    Recent Labs: No results found for requested labs within last 8760 hours.   Recent Lipid Panel No results found for: CHOL, TRIG, HDL, CHOLHDL, LDLCALC, LDLDIRECT  Wt Readings from Last 3 Encounters:  10/04/20 131 lb (59.4 kg)  03/31/20 131 lb 3.2 oz (59.5 kg)  03/15/20 122 lb (55.3 kg)     Objective:    Vital Signs:  Temp 98.5 F (36.9 C)    Ht 5\' 6"  (1.676 m)    Wt 131 lb (59.4 kg)    BMI 21.14 kg/m    Physical Exam No physical exam performed due to telemedicine visit  ASSESSMENT & PLAN:      1. Sinusitis, unspecified chronicity, unspecified location - amoxicillin-clavulanate (AUGMENTIN) 875-125 MG tablet; Take 1 tablet by mouth 2 (two) times daily.  Dispense: 20 tablet; Refill: 0  2. History of candidiasis of vagina - fluconazole (DIFLUCAN) 150 MG tablet; Take 1 tablet (150 mg total) by mouth once for 1 dose.  Dispense: 1 tablet; Refill: 0  Rest and push fluids Continue Zyrtec and Flonase daily Recommend flu and COVID-19 vaccines Recommend cessation of nicotine vaping Notify office if symptoms fail to  improve or worsen   COVID-19 Education: The signs and symptoms of COVID-19 were discussed with the patient and how to seek care for testing (follow up with PCP or arrange E-visit). The importance of social distancing was discussed today.  Telephone time: 09:50-09:57  I spent 15 minutes dedicated to the care of this patient on the date of this encounter to include telephone time with the patient, as well of EMR and prescription medication management.  Follow Up:  Virtual Visit  prn  Signed,  [id number], DNP  10/04/2020 19:04    [facility] [place]

## 2020-10-06 ENCOUNTER — Ambulatory Visit (INDEPENDENT_AMBULATORY_CARE_PROVIDER_SITE_OTHER): Payer: BC Managed Care – PPO

## 2020-10-06 DIAGNOSIS — U071 COVID-19: Secondary | ICD-10-CM | POA: Diagnosis not present

## 2020-10-06 LAB — POC COVID19 BINAXNOW: SARS Coronavirus 2 Ag: NEGATIVE

## 2020-10-06 NOTE — Progress Notes (Signed)
Patient came in for rapid COVID test as required for work.  Results for orders placed or performed in visit on 10/06/20 (from the past 24 hour(s))  POC COVID-19     Status: Normal   Collection Time: 10/06/20  3:57 PM  Result Value Ref Range   SARS Coronavirus 2 Ag Negative Negative

## 2020-10-19 ENCOUNTER — Telehealth (INDEPENDENT_AMBULATORY_CARE_PROVIDER_SITE_OTHER): Payer: BC Managed Care – PPO | Admitting: Nurse Practitioner

## 2020-10-19 ENCOUNTER — Encounter: Payer: Self-pay | Admitting: Nurse Practitioner

## 2020-10-19 VITALS — HR 89 | Temp 99.3°F | Ht 67.0 in | Wt 130.0 lb

## 2020-10-19 DIAGNOSIS — Z20822 Contact with and (suspected) exposure to covid-19: Secondary | ICD-10-CM | POA: Diagnosis not present

## 2020-10-19 DIAGNOSIS — Z8619 Personal history of other infectious and parasitic diseases: Secondary | ICD-10-CM | POA: Diagnosis not present

## 2020-10-19 DIAGNOSIS — J0191 Acute recurrent sinusitis, unspecified: Secondary | ICD-10-CM

## 2020-10-19 LAB — POC COVID19 BINAXNOW: SARS Coronavirus 2 Ag: NEGATIVE

## 2020-10-19 MED ORDER — FLUCONAZOLE 150 MG PO TABS: 150.0000 mg | ORAL_TABLET | Freq: Once | ORAL | 0 refills | Status: AC

## 2020-10-19 MED ORDER — AZITHROMYCIN 250 MG PO TABS: ORAL_TABLET | ORAL | 0 refills | Status: DC

## 2020-10-19 NOTE — Progress Notes (Signed)
Virtual Visit via Telephone Note   This visit type was conducted due to national recommendations for restrictions regarding the COVID-19 Pandemic (e.g. social distancing) in an effort to limit this patient's exposure and mitigate transmission in our community.  Due to her co-morbid illnesses, this patient is at least at moderate risk for complications without adequate follow up.  This format is felt to be most appropriate for this patient at this time.  The patient did not have access to video technology/had technical difficulties with video requiring transitioning to audio format only (telephone).  All issues noted in this document were discussed and addressed.  No physical exam could be performed with this format.  Patient verbally consented to a telehealth visit.   Date:  10/19/2020   ID:  Rebecca Wallace, DOB [birth year], MRN [id number]  Patient Location: Home Provider Location: Home Office  PCP:  Blane Ohara, MD   Evaluation Performed:  Established patient, acute telemedicine visit  Chief Complaint:  Fever  History of Present Illness:    Rebecca Wallace is a 35 y.o. female with fever, sinus congestion/pressure, cough, and watery eyes. Onset of symptoms was 5-days ago. Treatment has included Zyrtec. She was treated for sinusitis on 10/06/2020 with a coarse of Augmentin. She states symptoms had resolved completely. She tells me that she was in contact with a co-worker that has recently tested positive for COVID-19.    The patient does have symptoms concerning for COVID-19 infection (fever, chills, cough, or new shortness of breath).    Past Medical History:  Diagnosis Date  . Allergy to other foods   . 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 GERD (gastroesophageal reflux disease)   . History of cervical cancer   . Idiopathic hypotension   . Insomnia due to other mental disorder   . Lyme disease   . Major depressive disorder, recurrent, moderate (HCC)   . Migraine without  aura, not intractable, without status migrainosus   . Other idiopathic scoliosis, lumbar region   . Premenstrual tension syndrome   . Small intestinal bacterial overgrowth   . Xerosis cutis     Past Surgical History:  Procedure Laterality Date  . ANAL FISSURE REPAIR  01/11/2019  . BREAST ENHANCEMENT SURGERY  08/18/2009  . CHOLECYSTECTOMY  2018  . GANGLION CYST EXCISION     Left hand 2005  . KNEE SURGERY Left 12/2017  . SINOSCOPY  09/20/2011  . TONSILLECTOMY  10/2007    Family History  Problem Relation Age of Onset  . Diabetes Mother   . Sjogren's syndrome Mother   . High blood pressure Mother   . Anxiety disorder Mother   . Depression Mother   . Diabetes Father   . High blood pressure Maternal Grandmother   . Heart Problems Maternal Grandmother   . Heart attack Maternal Grandfather   . COPD Other     Social History   Socioeconomic History  . Marital status: Single    Spouse name: Not on file  . Number of children: Not on file  . Years of education: Not on file  . Highest education level: Not on file  Occupational History  . Occupation: Conservator, museum/gallery  Tobacco Use  . Smoking status: Former Smoker    Packs/day: 1.00    Years: 10.00    Pack years: 10.00    Types: Cigarettes    Quit date: 2016    Years since quitting: 6.0  . Smokeless tobacco: Never Used  Vaping Use  .  Vaping Use: Every day  . Devices: E-cig  Substance and Sexual Activity  . Alcohol use: Yes    Comment: Seldom  . Drug use: Never  . Sexual activity: Not on file  Other Topics Concern  . Not on file  Social History Narrative  . Not on file   Social Determinants of Health   Financial Resource Strain: Not on file  Food Insecurity: Not on file  Transportation Needs: Not on file  Physical Activity: Not on file  Stress: Not on file  Social Connections: Not on file  Intimate Partner Violence: Not on file    Outpatient Medications Prior to Visit  Medication Sig Dispense Refill  . celecoxib  (CELEBREX) 200 MG capsule Take 1 capsule by mouth twice daily 60 capsule 0  . cetirizine (ZYRTEC) 10 MG tablet Take 10 mg by mouth daily.    . cyclobenzaprine (FLEXERIL) 5 MG tablet TAKE 1 TABLET BY MOUTH THREE TIMES DAILY 270 tablet 2  . diazepam (VALIUM) 5 MG tablet TAKE 1 TABLET BY MOUTH AT BEDTIME AS NEEDED FOR BACK SPASMS 15 tablet 3  . Eszopiclone 3 MG TABS Take 1 tablet (3 mg total) by mouth at bedtime. Take immediately before bedtime. Stop ambien. 30 tablet 2  . fluticasone (FLONASE) 50 MCG/ACT nasal spray Use one to two sprays in each nostril once daily as directed for nasal congestion. 16 g 5  . LORazepam (ATIVAN) 0.5 MG tablet Take 1 tablet by mouth twice daily as needed for anxiety 60 tablet 2  . Magnesium Oxide 500 MG TABS Take 4 tablets by mouth at bedtime.    . Multiple Vitamin (MULTIVITAMIN) tablet Take 1 tablet by mouth daily.    . promethazine (PHENERGAN) 25 MG tablet TAKE 1 TABLET BY MOUTH EVERY 6 HOURS AS NEEDED FOR NAUSEA AND VOMITING 40 tablet 0  . VELIVET 0.1/0.125/0.15 -0.025 MG tablet Take 1 tablet by mouth daily.    Marland Kitchen venlafaxine XR (EFFEXOR-XR) 37.5 MG 24 hr capsule Take 2 capsules by mouth once daily 180 capsule 0  . amoxicillin-clavulanate (AUGMENTIN) 875-125 MG tablet Take 1 tablet by mouth 2 (two) times daily. 20 tablet 0   No facility-administered medications prior to visit.    Allergies:   Patient has no known allergies.   Social History   Tobacco Use  . Smoking status: Former Smoker    Packs/day: 1.00    Years: 10.00    Pack years: 10.00    Types: Cigarettes    Quit date: 2016    Years since quitting: 6.0  . Smokeless tobacco: Never Used  Vaping Use  . Vaping Use: Every day  . Devices: E-cig  Substance Use Topics  . Alcohol use: Yes    Comment: Seldom  . Drug use: Never     Review of Systems  Constitutional: Positive for chills, fever and malaise/fatigue.  HENT: Positive for congestion, sinus pain and sore throat. Negative for ear pain.         Post-nasal drip  Eyes: Positive for discharge (watery ).  Respiratory: Positive for cough. Negative for sputum production, shortness of breath and wheezing.   Cardiovascular: Negative for chest pain, palpitations, orthopnea and leg swelling.  Gastrointestinal: Negative for abdominal pain, constipation, diarrhea, nausea and vomiting.  Genitourinary: Negative for dysuria, frequency and urgency.  Musculoskeletal: Negative for myalgias.  Skin: Negative for rash.  Neurological: Positive for headaches.  Endo/Heme/Allergies: Positive for environmental allergies.     Labs/Other Tests and Data Reviewed:    Recent Labs:  No results found for requested labs within last 8760 hours.   Recent Lipid Panel No results found for: CHOL, TRIG, HDL, CHOLHDL, LDLCALC, LDLDIRECT  Wt Readings from Last 3 Encounters:  10/19/20 130 lb (59 kg)  10/04/20 131 lb (59.4 kg)  03/31/20 131 lb 3.2 oz (59.5 kg)     Objective:    Vital Signs:  Pulse 89   Temp 99.3 F (37.4 C)   Ht 5\' 7"  (1.702 m)   Wt 130 lb (59 kg)   BMI 20.36 kg/m    Physical Exam No physical exam due to telemedicine visit  ASSESSMENT & PLAN:    1. Acute recurrent sinusitis, unspecified location - azithromycin (ZITHROMAX) 250 MG tablet; Take two tablets by mouth on day one, take one tablet by mouth days two-five  Dispense: 6 tablet; Refill: 0  2. Close exposure to COVID-19 virus - POC COVID-19  3. History of candidiasis of vagina - fluconazole (DIFLUCAN) 150 MG tablet; Take 1 tablet (150 mg total) by mouth once for 1 dose.  Dispense: 1 tablet; Refill: 0     COVID-19 Education: The signs and symptoms of COVID-19 were discussed with the patient and how to seek care for testing (follow up with PCP or arrange E-visit). The importance of social distancing was discussed today.   I spent 10 minutes dedicated to the care of this patient on the date of this encounter to include telephone time with the patient, as well as:EMR review and  prescription medication.    Follow Up:  Virtual Visit  prn  Signed,  , NP  10/19/2020 19:25    [facility] [place]

## 2020-10-25 ENCOUNTER — Other Ambulatory Visit: Payer: Self-pay | Admitting: Family Medicine

## 2020-11-23 ENCOUNTER — Other Ambulatory Visit: Payer: Self-pay | Admitting: Family Medicine

## 2020-12-23 ENCOUNTER — Other Ambulatory Visit: Payer: Self-pay | Admitting: Legal Medicine

## 2020-12-23 ENCOUNTER — Other Ambulatory Visit: Payer: Self-pay | Admitting: Family Medicine

## 2020-12-24 ENCOUNTER — Other Ambulatory Visit: Payer: Self-pay | Admitting: Legal Medicine

## 2020-12-24 NOTE — Telephone Encounter (Signed)
This is your patient lp 

## 2021-01-11 ENCOUNTER — Other Ambulatory Visit: Payer: Self-pay | Admitting: Family Medicine

## 2021-01-13 ENCOUNTER — Other Ambulatory Visit: Payer: Self-pay | Admitting: Family Medicine

## 2021-01-13 DIAGNOSIS — F419 Anxiety disorder, unspecified: Secondary | ICD-10-CM

## 2021-01-22 ENCOUNTER — Other Ambulatory Visit: Payer: Self-pay | Admitting: Family Medicine

## 2021-01-25 ENCOUNTER — Other Ambulatory Visit: Payer: Self-pay | Admitting: Family Medicine

## 2021-01-25 MED ORDER — ESZOPICLONE 3 MG PO TABS: ORAL_TABLET | ORAL | 0 refills | Status: DC

## 2021-01-25 NOTE — Progress Notes (Signed)
Subjective:  Patient ID: Rebecca Wallace, female    DOB: 07/18/1986  Age: 35 y.o. MRN: [id number]  Chief Complaint  Patient presents with  . Medication Management    HPI Muscle pain: Uses diazepam sparingly. On flexeril and celebrex.  Insomnia -lunesta 3 mg once daily at night.  Depression/anxiety: On venlafaxine er and lorazepam. Doing well. Feels really great.   Current Outpatient Medications on File Prior to Visit  Medication Sig Dispense Refill  . celecoxib (CELEBREX) 200 MG capsule Take 1 capsule by mouth twice daily 60 capsule 0  . cetirizine (ZYRTEC) 10 MG tablet Take 10 mg by mouth daily.    . cyclobenzaprine (FLEXERIL) 5 MG tablet TAKE 1 TABLET BY MOUTH THREE TIMES DAILY 270 tablet 2  . diazepam (VALIUM) 5 MG tablet TAKE 1 TABLET BY MOUTH AT BEDTIME AS NEEDED FOR  BACK  SPASMS 15 tablet 0  . Eszopiclone 3 MG TABS TAKE 1 TABLET BY MOUTH AT BEDTIME 30 tablet 0  . fluticasone (FLONASE) 50 MCG/ACT nasal spray Use one to two sprays in each nostril once daily as directed for nasal congestion. 16 g 5  . LORazepam (ATIVAN) 0.5 MG tablet TAKE 1 TABLET BY MOUTH TWICE DAILY AS NEEDED FOR ANXIETY (PLEASE CALL AND SET UP A CHRONIC FOLLOW UP APPOINTMENT) 60 tablet 3  . Magnesium Oxide 500 MG TABS Take 4 tablets by mouth at bedtime.    . Multiple Vitamin (MULTIVITAMIN) tablet Take 1 tablet by mouth daily.    . promethazine (PHENERGAN) 25 MG tablet TAKE 1 TABLET BY MOUTH EVERY 6 HOURS AS NEEDED FOR NAUSEA AND VOMITING 40 tablet 0  . VELIVET 0.1/0.125/0.15 -0.025 MG tablet Take 1 tablet by mouth daily.    Marland Kitchen venlafaxine XR (EFFEXOR-XR) 37.5 MG 24 hr capsule Take 2 capsules by mouth once daily 180 capsule 2   No current facility-administered medications on file prior to visit.   Past Medical History:  Diagnosis Date  . Allergy to other foods   . 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 GERD (gastroesophageal reflux disease)   . History of cervical cancer   . Idiopathic hypotension   .  Insomnia due to other mental disorder   . Lyme disease   . Major depressive disorder, recurrent, moderate (HCC)   . Migraine without aura, not intractable, without status migrainosus   . Other idiopathic scoliosis, lumbar region   . Premenstrual tension syndrome   . Small intestinal bacterial overgrowth   . Xerosis cutis    Past Surgical History:  Procedure Laterality Date  . ANAL FISSURE REPAIR  01/11/2019  . BREAST ENHANCEMENT SURGERY  08/18/2009  . CHOLECYSTECTOMY  2018  . GANGLION CYST EXCISION     Left hand 2005  . KNEE SURGERY Left 12/2017  . SINOSCOPY  09/20/2011  . TONSILLECTOMY  10/2007    Family History  Problem Relation Age of Onset  . Diabetes Mother   . Sjogren's syndrome Mother   . High blood pressure Mother   . Anxiety disorder Mother   . Depression Mother   . Diabetes Father   . High blood pressure Maternal Grandmother   . Heart Problems Maternal Grandmother   . Heart attack Maternal Grandfather   . COPD Other    Social History   Socioeconomic History  . Marital status: Single    Spouse name: Not on file  . Number of children: Not on file  . Years of education: Not on file  . Highest education level:  Not on file  Occupational History  . Occupation: Conservator, museum/gallery  Tobacco Use  . Smoking status: Former Smoker    Packs/day: 1.00    Years: 10.00    Pack years: 10.00    Types: Cigarettes    Quit date: 2016    Years since quitting: 6.3  . Smokeless tobacco: Never Used  Vaping Use  . Vaping Use: Every day  . Devices: E-cig  Substance and Sexual Activity  . Alcohol use: Yes    Comment: Seldom  . Drug use: Never  . Sexual activity: Not on file  Other Topics Concern  . Not on file  Social History Narrative  . Not on file   Social Determinants of Health   Financial Resource Strain: Not on file  Food Insecurity: Not on file  Transportation Needs: Not on file  Physical Activity: Not on file  Stress: Not on file  Social Connections: Not on file     Review of Systems  Constitutional: Negative for chills, fatigue and fever.  HENT: Negative for congestion, ear pain, rhinorrhea and sore throat.   Respiratory: Negative for cough and shortness of breath.   Cardiovascular: Negative for chest pain.  Gastrointestinal: Negative for abdominal pain, constipation, diarrhea, nausea and vomiting.  Genitourinary: Negative for dysuria and urgency.  Musculoskeletal: Negative for back pain and myalgias.  Neurological: Negative for dizziness, weakness, light-headedness and headaches.  Psychiatric/Behavioral: Negative for dysphoric mood. The patient is not nervous/anxious.      Objective:  BP 104/70   Pulse 100   Temp (!) 97 F (36.1 C)   Resp 14   Wt 131 lb (59.4 kg)   BMI 20.52 kg/m   BP/Weight 01/26/2021 10/19/2020 10/04/2020  Systolic BP 104 - -  Diastolic BP 70 - -  Wt. (Lbs) 131 130 131  BMI 20.52 20.36 21.14    Physical Exam Vitals reviewed.  Constitutional:      Appearance: Normal appearance. She is normal weight.  Neck:     Vascular: No carotid bruit.  Cardiovascular:     Rate and Rhythm: Normal rate and regular rhythm.     Pulses: Normal pulses.     Heart sounds: Normal heart sounds.  Pulmonary:     Effort: Pulmonary effort is normal. No respiratory distress.     Breath sounds: Normal breath sounds.  Abdominal:     General: Abdomen is flat. Bowel sounds are normal.     Palpations: Abdomen is soft.     Tenderness: There is no abdominal tenderness.  Neurological:     Mental Status: She is alert and oriented to person, place, and time.  Psychiatric:        Mood and Affect: Mood normal.        Behavior: Behavior normal.     Diabetic Foot Exam - Simple   No data filed      Lab Results  Component Value Date   WBC 6.1 02/04/2021   HGB 14.2 02/04/2021   HCT 42.9 02/04/2021   PLT 270 02/04/2021   GLUCOSE 83 02/04/2021   CHOL 156 02/04/2021   TRIG 93 02/04/2021   HDL 84 02/04/2021   LDLCALC 55 02/04/2021    ALT 15 02/04/2021   AST 21 02/04/2021   NA 134 02/04/2021   K 4.7 02/04/2021   CL 100 02/04/2021   CREATININE 0.86 02/04/2021   BUN 14 02/04/2021   CO2 23 02/04/2021   TSH 0.907 02/04/2021      Assessment & Plan:  1. Myalgia Mild. On muscle relaxants.   2. Primary insomnia Continue lunesta  3. Mild recurrent major depression (HCC)  The current medical regimen is effective;  continue present plan and medications.  Follow-up: Return in about 9 days (around 02/04/2021) for nurse visit for fasting labs. Needs Follow up with me in 6 months. not fasting.Marland Kitchen  An After Visit Summary was printed and given to the patient.  Blane Ohara, MD [facility] [phone]

## 2021-01-26 ENCOUNTER — Other Ambulatory Visit: Payer: Self-pay

## 2021-01-26 ENCOUNTER — Ambulatory Visit: Payer: BC Managed Care – PPO | Admitting: Family Medicine

## 2021-01-26 VITALS — BP 104/70 | HR 100 | Temp 97.0°F | Resp 14 | Wt 131.0 lb

## 2021-01-26 DIAGNOSIS — M791 Myalgia, unspecified site: Secondary | ICD-10-CM

## 2021-01-26 DIAGNOSIS — F5101 Primary insomnia: Secondary | ICD-10-CM

## 2021-01-26 DIAGNOSIS — F330 Major depressive disorder, recurrent, mild: Secondary | ICD-10-CM | POA: Diagnosis not present

## 2021-01-26 DIAGNOSIS — M542 Cervicalgia: Secondary | ICD-10-CM | POA: Diagnosis not present

## 2021-01-26 DIAGNOSIS — M9903 Segmental and somatic dysfunction of lumbar region: Secondary | ICD-10-CM | POA: Diagnosis not present

## 2021-01-26 DIAGNOSIS — M5451 Vertebrogenic low back pain: Secondary | ICD-10-CM | POA: Diagnosis not present

## 2021-01-26 DIAGNOSIS — M9901 Segmental and somatic dysfunction of cervical region: Secondary | ICD-10-CM | POA: Diagnosis not present

## 2021-02-04 ENCOUNTER — Other Ambulatory Visit: Payer: BC Managed Care – PPO

## 2021-02-04 ENCOUNTER — Other Ambulatory Visit: Payer: Self-pay | Admitting: Family Medicine

## 2021-02-04 DIAGNOSIS — M9901 Segmental and somatic dysfunction of cervical region: Secondary | ICD-10-CM | POA: Diagnosis not present

## 2021-02-04 DIAGNOSIS — R5383 Other fatigue: Secondary | ICD-10-CM

## 2021-02-04 DIAGNOSIS — M9903 Segmental and somatic dysfunction of lumbar region: Secondary | ICD-10-CM | POA: Diagnosis not present

## 2021-02-04 DIAGNOSIS — M542 Cervicalgia: Secondary | ICD-10-CM | POA: Diagnosis not present

## 2021-02-04 DIAGNOSIS — M5451 Vertebrogenic low back pain: Secondary | ICD-10-CM | POA: Diagnosis not present

## 2021-02-05 LAB — LIPID PANEL
Chol/HDL Ratio: 1.9 ratio (ref 0.0–4.4)
Cholesterol, Total: 156 mg/dL (ref 100–199)
HDL: 84 mg/dL (ref >39)
LDL Chol Calc (NIH): 55 mg/dL (ref 0–99)
Triglycerides: 93 mg/dL (ref 0–149)
VLDL Cholesterol Cal: 17 mg/dL (ref 5–40)

## 2021-02-05 LAB — COMPREHENSIVE METABOLIC PANEL
ALT: 15 IU/L (ref 0–32)
AST: 21 IU/L (ref 0–40)
Albumin/Globulin Ratio: 2.1 (ref 1.2–2.2)
Albumin: 4.4 g/dL (ref 3.8–4.8)
Alkaline Phosphatase: 44 IU/L (ref 44–121)
BUN/Creatinine Ratio: 16 (ref 9–23)
BUN: 14 mg/dL (ref 6–20)
Bilirubin Total: 0.3 mg/dL (ref 0.0–1.2)
CO2: 23 mmol/L (ref 20–29)
Calcium: 9.4 mg/dL (ref 8.7–10.2)
Chloride: 100 mmol/L (ref 96–106)
Creatinine, Ser: 0.86 mg/dL (ref 0.57–1.00)
Globulin, Total: 2.1 g/dL (ref 1.5–4.5)
Glucose: 83 mg/dL (ref 65–99)
Potassium: 4.7 mmol/L (ref 3.5–5.2)
Sodium: 134 mmol/L (ref 134–144)
Total Protein: 6.5 g/dL (ref 6.0–8.5)
eGFR: 91 mL/min/{1.73_m2} (ref >59)

## 2021-02-05 LAB — CBC WITH DIFFERENTIAL/PLATELET
Basophils Absolute: 0.1 10*3/uL (ref 0.0–0.2)
Basos: 1 %
EOS (ABSOLUTE): 0.1 10*3/uL (ref 0.0–0.4)
Eos: 2 %
Hematocrit: 42.9 % (ref 34.0–46.6)
Hemoglobin: 14.2 g/dL (ref 11.1–15.9)
Immature Grans (Abs): 0 10*3/uL (ref 0.0–0.1)
Immature Granulocytes: 0 %
Lymphocytes Absolute: 1.6 10*3/uL (ref 0.7–3.1)
Lymphs: 27 %
MCH: 32.6 pg (ref 26.6–33.0)
MCHC: 33.1 g/dL (ref 31.5–35.7)
MCV: 99 fL — ABNORMAL HIGH (ref 79–97)
Monocytes Absolute: 0.5 10*3/uL (ref 0.1–0.9)
Monocytes: 9 %
Neutrophils Absolute: 3.7 10*3/uL (ref 1.4–7.0)
Neutrophils: 61 %
Platelets: 270 10*3/uL (ref 150–450)
RBC: 4.35 x10E6/uL (ref 3.77–5.28)
RDW: 12.1 % (ref 11.7–15.4)
WBC: 6.1 10*3/uL (ref 3.4–10.8)

## 2021-02-05 LAB — TSH: TSH: 0.907 u[IU]/mL (ref 0.450–4.500)

## 2021-02-05 LAB — CARDIOVASCULAR RISK ASSESSMENT

## 2021-02-13 ENCOUNTER — Encounter: Payer: Self-pay | Admitting: Family Medicine

## 2021-02-21 ENCOUNTER — Telehealth (INDEPENDENT_AMBULATORY_CARE_PROVIDER_SITE_OTHER): Payer: BC Managed Care – PPO | Admitting: Nurse Practitioner

## 2021-02-21 ENCOUNTER — Encounter: Payer: Self-pay | Admitting: Nurse Practitioner

## 2021-02-21 ENCOUNTER — Other Ambulatory Visit: Payer: Self-pay | Admitting: Family Medicine

## 2021-02-21 VITALS — HR 85 | Temp 99.1°F | Ht 66.0 in | Wt 131.0 lb

## 2021-02-21 DIAGNOSIS — J0191 Acute recurrent sinusitis, unspecified: Secondary | ICD-10-CM

## 2021-02-21 DIAGNOSIS — J302 Other seasonal allergic rhinitis: Secondary | ICD-10-CM

## 2021-02-21 DIAGNOSIS — J3089 Other allergic rhinitis: Secondary | ICD-10-CM

## 2021-02-21 MED ORDER — AZITHROMYCIN 250 MG PO TABS: ORAL_TABLET | ORAL | 0 refills | Status: AC

## 2021-02-21 NOTE — Progress Notes (Signed)
Virtual Visit via Video Note   This visit type was conducted due to national recommendations for restrictions regarding the COVID-19 Pandemic (e.g. social distancing) in an effort to limit this patient's exposure and mitigate transmission in our community.  Due to her co-morbid illnesses, this patient is at least at moderate risk for complications without adequate follow up.  This format is felt to be most appropriate for this patient at this time.  All issues noted in this document were discussed and addressed.  A limited physical exam was performed with this format.  A verbal consent was obtained for the virtual visit.   Date:  02/21/2021   ID:  Rebecca Wallace, DOB 1985-11-14, MRN [id number]  Patient Location: Home Provider Location: Office/Clinic  PCP:  Blane Ohara, MD   Evaluation Performed:  Established patient, acute telemedicine visit  Chief Complaint:  Sinus congestion  History of Present Illness:    Rebecca Wallace is a 35 y.o. female with sinus congestion/pain, post-nasal-drip, teeth pain, and bilateral ear fullness. States she had a low-grade fever of 99.1 and "raw" throat from sinus drainage.Onset of symptoms was 3-days ago. Denies body aches or known exposure to ill contacts. She denies taking home COVID-19 test.Jake has chronic allergic rhinitis.Treatment has included Flonase, OTC antihistamine, Mucinex, OTC sinus pressure medication, and Tylenol.    The patient does have symptoms concerning for COVID-19 infection (fever, chills, cough, or new shortness of breath).    Past Medical History:  Diagnosis Date  . Allergy to other foods   . 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 GERD (gastroesophageal reflux disease)   . History of cervical cancer   . Idiopathic hypotension   . Insomnia due to other mental disorder   . Lyme disease   . Major depressive disorder, recurrent, moderate (HCC)   . Migraine without aura, not intractable, without status migrainosus   .  Other idiopathic scoliosis, lumbar region   . Premenstrual tension syndrome   . Small intestinal bacterial overgrowth   . Xerosis cutis     Past Surgical History:  Procedure Laterality Date  . ANAL FISSURE REPAIR  01/11/2019  . BREAST ENHANCEMENT SURGERY  08/18/2009  . CHOLECYSTECTOMY  2018  . GANGLION CYST EXCISION     Left hand 2005  . KNEE SURGERY Left 12/2017  . SINOSCOPY  09/20/2011  . TONSILLECTOMY  10/2007    Family History  Problem Relation Age of Onset  . Diabetes Mother   . Sjogren's syndrome Mother   . High blood pressure Mother   . Anxiety disorder Mother   . Depression Mother   . Diabetes Father   . High blood pressure Maternal Grandmother   . Heart Problems Maternal Grandmother   . Heart attack Maternal Grandfather   . COPD Other     Social History   Socioeconomic History  . Marital status: Single    Spouse name: Not on file  . Number of children: Not on file  . Years of education: Not on file  . Highest education level: Not on file  Occupational History  . Occupation: Conservator, museum/gallery  Tobacco Use  . Smoking status: Former Smoker    Packs/day: 1.00    Years: 10.00    Pack years: 10.00    Types: Cigarettes    Quit date: 2016    Years since quitting: 6.3  . Smokeless tobacco: Never Used  Vaping Use  . Vaping Use: Every day  . Devices: E-cig  Substance and Sexual Activity  .  Alcohol use: Yes    Comment: Seldom  . Drug use: Never  . Sexual activity: Not on file  Other Topics Concern  . Not on file  Social History Narrative  . Not on file   Social Determinants of Health   Financial Resource Strain: Not on file  Food Insecurity: Not on file  Transportation Needs: Not on file  Physical Activity: Not on file  Stress: Not on file  Social Connections: Not on file  Intimate Partner Violence: Not on file    Outpatient Medications Prior to Visit  Medication Sig Dispense Refill  . celecoxib (CELEBREX) 200 MG capsule Take 1 capsule by mouth  twice daily 60 capsule 0  . cetirizine (ZYRTEC) 10 MG tablet Take 10 mg by mouth daily.    . cyclobenzaprine (FLEXERIL) 5 MG tablet TAKE 1 TABLET BY MOUTH THREE TIMES DAILY 270 tablet 2  . diazepam (VALIUM) 5 MG tablet TAKE 1 TABLET BY MOUTH AT BEDTIME AS NEEDED FOR  BACK  SPASMS 15 tablet 0  . Eszopiclone 3 MG TABS TAKE 1 TABLET BY MOUTH AT BEDTIME 30 tablet 0  . fluticasone (FLONASE) 50 MCG/ACT nasal spray Use one to two sprays in each nostril once daily as directed for nasal congestion. 16 g 5  . LORazepam (ATIVAN) 0.5 MG tablet TAKE 1 TABLET BY MOUTH TWICE DAILY AS NEEDED FOR ANXIETY (PLEASE CALL AND SET UP A CHRONIC FOLLOW UP APPOINTMENT) 60 tablet 3  . Magnesium Oxide 500 MG TABS Take 4 tablets by mouth at bedtime.    . Multiple Vitamin (MULTIVITAMIN) tablet Take 1 tablet by mouth daily.    . promethazine (PHENERGAN) 25 MG tablet TAKE 1 TABLET BY MOUTH EVERY 6 HOURS AS NEEDED FOR NAUSEA AND VOMITING 40 tablet 0  . VELIVET 0.1/0.125/0.15 -0.025 MG tablet Take 1 tablet by mouth daily.    Marland Kitchen venlafaxine XR (EFFEXOR-XR) 37.5 MG 24 hr capsule Take 2 capsules by mouth once daily 180 capsule 2   No facility-administered medications prior to visit.    Allergies:   Patient has no known allergies.   Social History   Tobacco Use  . Smoking status: Former Smoker    Packs/day: 1.00    Years: 10.00    Pack years: 10.00    Types: Cigarettes    Quit date: 2016    Years since quitting: 6.3  . Smokeless tobacco: Never Used  Vaping Use  . Vaping Use: Every day  . Devices: E-cig  Substance Use Topics  . Alcohol use: Yes    Comment: Seldom  . Drug use: Never     Review of Systems  Constitutional: Positive for fever (low-grade) and malaise/fatigue.  HENT: Positive for congestion, ear pain (bilateral ear pressure), sinus pain and sore throat.        Post-nasal-drip, teeth pain  Eyes: Positive for pain (pressure behind bilateral eyes).  Respiratory: Positive for cough. Negative for shortness  of breath.   Cardiovascular: Negative.   Gastrointestinal: Negative.   Genitourinary: Negative.   Musculoskeletal: Negative.   Skin: Negative for rash.  Neurological: Positive for headaches (frontal and maxillary sinuses).  Endo/Heme/Allergies: Positive for environmental allergies.  Psychiatric/Behavioral: Negative.      Labs/Other Tests and Data Reviewed:    Recent Labs: 02/04/2021: ALT 15; BUN 14; Creatinine, Ser 0.86; Hemoglobin 14.2; Platelets 270; Potassium 4.7; Sodium 134; TSH 0.907   Recent Lipid Panel Lab Results  Component Value Date/Time   CHOL 156 02/04/2021 12:34 PM   TRIG 93 02/04/2021 12:34  PM   HDL 84 02/04/2021 12:34 PM   CHOLHDL 1.9 02/04/2021 12:34 PM   LDLCALC 55 02/04/2021 12:34 PM    Wt Readings from Last 3 Encounters:  02/21/21 131 lb (59.4 kg)  01/26/21 131 lb (59.4 kg)  10/19/20 130 lb (59 kg)     Objective:    Vital Signs:  Pulse 85   Temp 99.1 F (37.3 C)   Ht 5\' 6"  (1.676 m)   Wt 131 lb (59.4 kg)   BMI 21.14 kg/m    Physical Exam No physical exam due to telemedicine visit  ASSESSMENT & PLAN:    1. Acute recurrent sinusitis, unspecified location - azithromycin (ZITHROMAX) 250 MG tablet; Take 2 tablets on day 1, then 1 tablet daily on days 2 through 5  Dispense: 6 tablet; Refill: 0  2. Seasonal and perennial allergic rhinitis -Continue Flonase -Continue Zyrtec -Remove laundry basket from bedroom   Rest and push fluids Continue symptom management with Mucinex, Flonase, and Zyrtec Notify office if symptoms fail to improve or worsen Follow up as needed   COVID-19 Education: The signs and symptoms of COVID-19 were discussed with the patient and how to seek care for testing (follow up with PCP or arrange E-visit). The importance of social distancing was discussed today.   I spent 10 minutes dedicated to the care of this patient on the date of this encounter to include face-to-face time with the patient, as well as:EMR review and  prescription medication management.  Follow Up:  In Person prn  Signed, , NP  02/21/2021 11:40 AM    [facility] [place]

## 2021-03-01 ENCOUNTER — Telehealth: Payer: Self-pay

## 2021-03-01 NOTE — Telephone Encounter (Signed)
Patient called and left message stating that she does not feel much better after finishing the Z-pack. Patient was seen last week for a video visit and was prescribed that and was instructed to call back if she was not feeling better to try something different and have it sent in to her pharmacy? Please advise!

## 2021-03-15 ENCOUNTER — Other Ambulatory Visit: Payer: Self-pay

## 2021-03-15 ENCOUNTER — Encounter: Payer: Self-pay | Admitting: Nurse Practitioner

## 2021-03-15 ENCOUNTER — Ambulatory Visit: Payer: BC Managed Care – PPO | Admitting: Nurse Practitioner

## 2021-03-15 VITALS — BP 108/82 | HR 86 | Temp 97.4°F | Ht 66.0 in | Wt 131.0 lb

## 2021-03-15 DIAGNOSIS — H6121 Impacted cerumen, right ear: Secondary | ICD-10-CM

## 2021-03-15 DIAGNOSIS — J309 Allergic rhinitis, unspecified: Secondary | ICD-10-CM

## 2021-03-15 DIAGNOSIS — J324 Chronic pansinusitis: Secondary | ICD-10-CM | POA: Diagnosis not present

## 2021-03-15 DIAGNOSIS — Z8619 Personal history of other infectious and parasitic diseases: Secondary | ICD-10-CM

## 2021-03-15 DIAGNOSIS — R5383 Other fatigue: Secondary | ICD-10-CM | POA: Diagnosis not present

## 2021-03-15 DIAGNOSIS — J029 Acute pharyngitis, unspecified: Secondary | ICD-10-CM

## 2021-03-15 MED ORDER — AMOXICILLIN-POT CLAVULANATE 875-125 MG PO TABS: 1.0000 | ORAL_TABLET | Freq: Two times a day (BID) | ORAL | 0 refills | Status: DC

## 2021-03-15 MED ORDER — FLUCONAZOLE 150 MG PO TABS: 150.0000 mg | ORAL_TABLET | Freq: Once | ORAL | 0 refills | Status: AC

## 2021-03-15 NOTE — Patient Instructions (Addendum)
We will call you with appointment with ENT and lab results Take Augmentin twice daily for 10 days Take Diflucan 150 mg for yeast infection Ear wax removed from right ear Follow-up as needed Sinusitis, Adult Sinusitis is soreness and swelling (inflammation) of your sinuses. Sinuses are hollow spaces in the bones around your face. They are located:  Around your eyes.  In the middle of your forehead.  Behind your nose.  In your cheekbones. Your sinuses and nasal passages are lined with a fluid called mucus. Mucus drains out of your sinuses. Swelling can trap mucus in your sinuses. This lets germs (bacteria, virus, or fungus) grow, which leads to infection. Most of the time, this condition is caused by a virus. What are the causes? This condition is caused by:  Allergies.  Asthma.  Germs.  Things that block your nose or sinuses.  Growths in the nose (nasal polyps).  Chemicals or irritants in the air.  Fungus (rare). What increases the risk? You are more likely to develop this condition if:  You have a weak body defense system (immune system).  You do a lot of swimming or diving.  You use nasal sprays too much.  You smoke. What are the signs or symptoms? The main symptoms of this condition are pain and a feeling of pressure around the sinuses. Other symptoms include:  Stuffy nose (congestion).  Runny nose (drainage).  Swelling and warmth in the sinuses.  Headache.  Toothache.  A cough that may get worse at night.  Mucus that collects in the throat or the back of the nose (postnasal drip).  Being unable to smell and taste.  Being very tired (fatigue).  A fever.  Sore throat.  Bad breath. How is this diagnosed? This condition is diagnosed based on:  Your symptoms.  Your medical history.  A physical exam.  Tests to find out if your condition is short-term (acute) or long-term (chronic). Your doctor may: ? Check your nose for growths  (polyps). ? Check your sinuses using a tool that has a light (endoscope). ? Check for allergies or germs. ? Do imaging tests, such as an MRI or CT scan. How is this treated? Treatment for this condition depends on the cause and whether it is short-term or long-term.  If caused by a virus, your symptoms should go away on their own within 10 days. You may be given medicines to relieve symptoms. They include: ? Medicines that shrink swollen tissue in the nose. ? Medicines that treat allergies (antihistamines). ? A spray that treats swelling of the nostrils. ? Rinses that help get rid of thick mucus in your nose (nasal saline washes).  If caused by bacteria, your doctor may wait to see if you will get better without treatment. You may be given antibiotic medicine if you have: ? A very bad infection. ? A weak body defense system.  If caused by growths in the nose, you may need to have surgery. Follow these instructions at home: Medicines  Take, use, or apply over-the-counter and prescription medicines only as told by your doctor. These may include nasal sprays.  If you were prescribed an antibiotic medicine, take it as told by your doctor. Do not stop taking the antibiotic even if you start to feel better. Hydrate and humidify  Drink enough water to keep your pee (urine) pale yellow.  Use a cool mist humidifier to keep the humidity level in your home above 50%.  Breathe in steam for 10-15 minutes, 3-4  times a day, or as told by your doctor. You can do this in the bathroom while a hot shower is running.  Try not to spend time in cool or dry air.   Rest  Rest as much as you can.  Sleep with your head raised (elevated).  Make sure you get enough sleep each night. General instructions  Put a warm, moist washcloth on your face 3-4 times a day, or as often as told by your doctor. This will help with discomfort.  Wash your hands often with soap and water. If there is no soap and  water, use hand sanitizer.  Do not smoke. Avoid being around people who are smoking (secondhand smoke).  Keep all follow-up visits as told by your doctor. This is important.   Contact a doctor if:  You have a fever.  Your symptoms get worse.  Your symptoms do not get better within 10 days. Get help right away if:  You have a very bad headache.  You cannot stop throwing up (vomiting).  You have very bad pain or swelling around your face or eyes.  You have trouble seeing.  You feel confused.  Your neck is stiff.  You have trouble breathing. Summary  Sinusitis is swelling of your sinuses. Sinuses are hollow spaces in the bones around your face.  This condition is caused by tissues in your nose that become inflamed or swollen. This traps germs. These can lead to infection.  If you were prescribed an antibiotic medicine, take it as told by your doctor. Do not stop taking it even if you start to feel better.  Keep all follow-up visits as told by your doctor. This is important. This information is not intended to replace advice given to you by your health care provider. Make sure you discuss any questions you have with your health care provider. Document Revised: 02/25/2018 Document Reviewed: 02/25/2018 Elsevier Patient Education  2021 [employer].

## 2021-03-15 NOTE — Progress Notes (Signed)
Acute Office Visit  Subjective:    Patient ID: Rebecca Wallace, female    DOB: [birth year], 35 y.o.   MRN: [id number]  Chief Complaint  Patient presents with  . Sinusitis    Recurrent    HPI Patient is in today for evaluation of chronic sinusitis. She had telemedicine visit on 02/21/21 for sinusitis and prescribed a course of antibiotics. She completed course of Azithromycin but states symptoms have returned 4-days-ago. She has a past medical history of chronic sinusitis. States she had sinus surgery in 2012 with Dr Deno Etienne from [facility]. She tells me that surgery improved chronic symptoms for several year but has experienced nearly monthly sinusitis since Oct 2021. She has agreed for referral back to ENT to manage symptoms. She was previously seen by an allergist for management for chronic allergic rhinitis. She tells me that former allergist informed her that previous Lyme disease has caused her to have compromised immune system.   Sinus Pain: Patient complains of achiness. Symptoms include facial pain with chills. Onset of symptoms was 4 days ago, gradually worsening since that time. She is drinking plenty of fluids.  Past history is significant for no history of pneumonia or bronchitis. Patient is former-smoker.   Past Medical History:  Diagnosis Date  . Allergy to other foods   . 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 GERD (gastroesophageal reflux disease)   . History of cervical cancer   . Idiopathic hypotension   . Insomnia due to other mental disorder   . Lyme disease   . Major depressive disorder, recurrent, moderate ([employer])   . Migraine without aura, not intractable, without status migrainosus   . Other idiopathic scoliosis, lumbar region   . Premenstrual tension syndrome   . Small intestinal bacterial overgrowth   . Xerosis cutis     Past Surgical History:  Procedure Laterality Date  . ANAL FISSURE REPAIR  01/11/2019  . BREAST ENHANCEMENT SURGERY   08/18/2009  . CHOLECYSTECTOMY  2018  . GANGLION CYST EXCISION     Left hand 2005  . KNEE SURGERY Left 12/2017  . SINOSCOPY  09/20/2011  . TONSILLECTOMY  10/2007    Family History  Problem Relation Age of Onset  . Diabetes Mother   . Sjogren's syndrome Mother   . High blood pressure Mother   . Anxiety disorder Mother   . Depression Mother   . Diabetes Father   . High blood pressure Maternal Grandmother   . Heart Problems Maternal Grandmother   . Heart attack Maternal Grandfather   . COPD Other     Social History   Socioeconomic History  . Marital status: Single    Spouse name: Not on file  . Number of children: Not on file  . Years of education: Not on file  . Highest education level: Not on file  Occupational History  . Occupation: Clinical cytogeneticist  Tobacco Use  . Smoking status: Former Smoker    Packs/day: 1.00    Years: 10.00    Pack years: 10.00    Types: Cigarettes    Quit date: 2016    Years since quitting: 6.4  . Smokeless tobacco: Never Used  Vaping Use  . Vaping Use: Every day  . Devices: E-cig  Substance and Sexual Activity  . Alcohol use: Yes    Comment: Seldom  . Drug use: Never  . Sexual activity: Not on file  Other Topics Concern  . Not on file  Social History Narrative  .  Not on file   Social Determinants of Health   Financial Resource Strain: Not on file  Food Insecurity: Not on file  Transportation Needs: Not on file  Physical Activity: Not on file  Stress: Not on file  Social Connections: Not on file  Intimate Partner Violence: Not on file    Outpatient Medications Prior to Visit  Medication Sig Dispense Refill  . celecoxib (CELEBREX) 200 MG capsule Take 1 capsule by mouth twice daily 60 capsule 0  . cetirizine (ZYRTEC) 10 MG tablet Take 10 mg by mouth daily.    . cyclobenzaprine (FLEXERIL) 5 MG tablet TAKE 1 TABLET BY MOUTH THREE TIMES DAILY 270 tablet 2  . diazepam (VALIUM) 5 MG tablet TAKE 1 TABLET BY MOUTH AT BEDTIME AS NEEDED FOR   BACK  SPASMS 15 tablet 0  . Eszopiclone 3 MG TABS TAKE 1 TABLET BY MOUTH AT BEDTIME 30 tablet 2  . fluticasone (FLONASE) 50 MCG/ACT nasal spray Use one to two sprays in each nostril once daily as directed for nasal congestion. 16 g 5  . LORazepam (ATIVAN) 0.5 MG tablet TAKE 1 TABLET BY MOUTH TWICE DAILY AS NEEDED FOR ANXIETY (PLEASE CALL AND SET UP A CHRONIC FOLLOW UP APPOINTMENT) 60 tablet 3  . Magnesium Oxide 500 MG TABS Take 4 tablets by mouth at bedtime.    . Multiple Vitamin (MULTIVITAMIN) tablet Take 1 tablet by mouth daily.    . promethazine (PHENERGAN) 25 MG tablet TAKE 1 TABLET BY MOUTH EVERY 6 HOURS AS NEEDED FOR NAUSEA AND VOMITING 40 tablet 0  . VELIVET 0.1/0.125/0.15 -0.025 MG tablet Take 1 tablet by mouth daily.    Marland Kitchen venlafaxine XR (EFFEXOR-XR) 37.5 MG 24 hr capsule Take 2 capsules by mouth once daily 180 capsule 2   No facility-administered medications prior to visit.    No Known Allergies  Review of Systems  Constitutional: Positive for fatigue. Negative for fever.       Generalized "achiness"  HENT: Positive for congestion, dental problem ("teeth hurt"), ear pain (bilateral ear fullness), postnasal drip, rhinorrhea, sinus pressure, sinus pain, sore throat and voice change.   Eyes: Positive for pain (pressure behing bilateral eyes).  Respiratory: Negative for cough, chest tightness, shortness of breath and wheezing.   Cardiovascular: Negative for chest pain and palpitations.  Gastrointestinal: Negative for abdominal pain, constipation, diarrhea, nausea and vomiting.  Endocrine: Negative.   Genitourinary: Negative for dysuria and hematuria.  Musculoskeletal: Negative for arthralgias, back pain, joint swelling and myalgias.  Skin: Negative for rash.  Allergic/Immunologic: Positive for environmental allergies and immunocompromised state.  Neurological: Negative for dizziness, weakness and headaches.  Psychiatric/Behavioral: Negative for dysphoric mood. The patient is not  nervous/anxious.        Objective:    Physical Exam Vitals reviewed.  Constitutional:      Appearance: Normal appearance.  HENT:     Head: Normocephalic.     Right Ear: There is impacted cerumen.     Left Ear: Tympanic membrane is erythematous.     Nose: Nasal tenderness, mucosal edema, congestion and rhinorrhea present.     Right Turbinates: Swollen.     Left Turbinates: Swollen.     Mouth/Throat:     Mouth: Mucous membranes are moist.     Pharynx: Posterior oropharyngeal erythema present.  Cardiovascular:     Rate and Rhythm: Normal rate and regular rhythm.     Pulses: Normal pulses.     Heart sounds: Normal heart sounds.  Pulmonary:  Effort: Pulmonary effort is normal.     Breath sounds: Normal breath sounds.  Abdominal:     Palpations: Abdomen is soft.  Musculoskeletal:        General: Normal range of motion.     Cervical back: Normal range of motion.  Skin:    General: Skin is warm and dry.  Neurological:     Mental Status: She is alert and oriented to person, place, and time.  Psychiatric:        Mood and Affect: Mood normal.        Behavior: Behavior normal.        Thought Content: Thought content normal.        Judgment: Judgment normal.     BP 108/82 (BP Location: Left Arm, Patient Position: Sitting)   Pulse 86   Temp (!) 97.4 F (36.3 C) (Temporal)   Ht '5\' 6"'  (1.676 m)   Wt 131 lb (59.4 kg)   SpO2 99%   BMI 21.14 kg/m  Wt Readings from Last 3 Encounters:  03/15/21 131 lb (59.4 kg)  02/21/21 131 lb (59.4 kg)  01/26/21 131 lb (59.4 kg)    Health Maintenance Due  Topic Date Due  . COVID-19 Vaccine (1) Never done  . HIV Screening  Never done  . Hepatitis C Screening  Never done  . TETANUS/TDAP  Never done  . PAP SMEAR-Modifier  Never done       Lab Results  Component Value Date   TSH 0.907 02/04/2021   Lab Results  Component Value Date   WBC 6.1 02/04/2021   HGB 14.2 02/04/2021   HCT 42.9 02/04/2021   MCV 99 (H) 02/04/2021    PLT 270 02/04/2021   Lab Results  Component Value Date   NA 134 02/04/2021   K 4.7 02/04/2021   CO2 23 02/04/2021   GLUCOSE 83 02/04/2021   BUN 14 02/04/2021   CREATININE 0.86 02/04/2021   BILITOT 0.3 02/04/2021   ALKPHOS 44 02/04/2021   AST 21 02/04/2021   ALT 15 02/04/2021   PROT 6.5 02/04/2021   ALBUMIN 4.4 02/04/2021   CALCIUM 9.4 02/04/2021   EGFR 91 02/04/2021   Lab Results  Component Value Date   CHOL 156 02/04/2021   Lab Results  Component Value Date   HDL 84 02/04/2021   Lab Results  Component Value Date   LDLCALC 55 02/04/2021   Lab Results  Component Value Date   TRIG 93 02/04/2021   Lab Results  Component Value Date   CHOLHDL 1.9 02/04/2021       Assessment & Plan:   1. Chronic pansinusitis - Ambulatory referral to ENT - amoxicillin-clavulanate (AUGMENTIN) 875-125 MG tablet; Take 1 tablet by mouth 2 (two) times daily.  Dispense: 20 tablet; Refill: 0  2. Chronic allergic rhinitis -continue Flonase nasal spray -continue Zyrtec 10 mg daily -continue nasal saline  3. Impacted cerumen, right ear - EAR CERUMEN REMOVAL  4. Sore throat - Mononucleosis Test, Qual W/ Reflex  5. Other fatigue - Mononucleosis Test, Qual W/ Reflex  6. History of candidiasis of vagina - fluconazole (DIFLUCAN) 150 MG tablet; Take 1 tablet (150 mg total) by mouth once for 1 dose.  Dispense: 1 tablet; Refill: 0   We will call you with appointment with ENT and lab results Take Augmentin twice daily for 10 days Take Diflucan 150 mg for yeast infection Ear wax removed from right ear Follow-up as needed  I , Lauren Peterson [place] as a scribe  for Rip Harbour, NP.,have documented all relevant documentation on the behalf of Rip Harbour, NP,as directed by  Rip Harbour, NP while in the presence of Rip Harbour, NP.  I, Rip Harbour, NP, have reviewed all documentation for this visit. The documentation on 03/15/21 for the exam, diagnosis, procedures,  and orders are all accurate and complete.  Follow-up: PRN  Signed, Jerrell Belfast, DNP

## 2021-03-16 LAB — MONO QUAL W/RFLX QN: Mono Qual W/Rflx Qn: NEGATIVE

## 2021-03-25 ENCOUNTER — Ambulatory Visit: Payer: BC Managed Care – PPO | Admitting: Nurse Practitioner

## 2021-03-25 ENCOUNTER — Encounter: Payer: Self-pay | Admitting: Nurse Practitioner

## 2021-03-25 VITALS — BP 104/60 | HR 72 | Temp 97.5°F | Resp 16 | Wt 134.4 lb

## 2021-03-25 DIAGNOSIS — Z8619 Personal history of other infectious and parasitic diseases: Secondary | ICD-10-CM | POA: Diagnosis not present

## 2021-03-25 DIAGNOSIS — R059 Cough, unspecified: Secondary | ICD-10-CM | POA: Diagnosis not present

## 2021-03-25 DIAGNOSIS — J029 Acute pharyngitis, unspecified: Secondary | ICD-10-CM | POA: Diagnosis not present

## 2021-03-25 DIAGNOSIS — H65113 Acute and subacute allergic otitis media (mucoid) (sanguinous) (serous), bilateral: Secondary | ICD-10-CM | POA: Diagnosis not present

## 2021-03-25 LAB — POCT RAPID STREP A (OFFICE): Rapid Strep A Screen: NEGATIVE

## 2021-03-25 LAB — POC COVID19 BINAXNOW: SARS Coronavirus 2 Ag: NEGATIVE

## 2021-03-25 MED ORDER — CEFDINIR 300 MG PO CAPS: 300.0000 mg | ORAL_CAPSULE | Freq: Two times a day (BID) | ORAL | 0 refills | Status: DC

## 2021-03-25 MED ORDER — FLUCONAZOLE 150 MG PO TABS: 150.0000 mg | ORAL_TABLET | Freq: Once | ORAL | 0 refills | Status: AC

## 2021-03-25 NOTE — Patient Instructions (Signed)
Take Cefdinir twice daily for 7 days Warm salt water gargles and throat lozenges for sore throat Rest and push fluids Tylenol or Ibuprofen for pain Keep scheduled appointment with ENT Follow-up as needed   Pharyngitis  Pharyngitis is a sore throat (pharynx). This is when there is redness, pain, and swelling in your throat. Most of the time, this condition gets better on its own. In some cases, you may needmedicine. Follow these instructions at home: Take over-the-counter and prescription medicines only as told by your doctor. If you were prescribed an antibiotic medicine, take it as told by your doctor. Do not stop taking the antibiotic even if you start to feel better. Do not give children aspirin. Aspirin has been linked to Reye syndrome. Drink enough water and fluids to keep your pee (urine) clear or pale yellow. Get a lot of rest. Rinse your mouth (gargle) with a salt-water mixture 3-4 times a day or as needed. To make a salt-water mixture, completely dissolve -1 tsp of salt in 1 cup of warm water. Do not swallow this mixture. If your doctor approves, you may use throat lozenges or sprays to soothe your throat. Contact a doctor if: You have large, tender lumps in your neck. You have a rash. You cough up green, yellow-brown, or bloody spit. Get help right away if: You have a stiff neck. You drool or cannot swallow liquids. You cannot drink or take medicines without throwing up. You have very bad pain that does not go away with medicine. You have problems breathing, and it is not from a stuffy nose. You have new pain and swelling in your knees, ankles, wrists, or elbows. Summary Pharyngitis is a sore throat (pharynx). This is when there is redness, pain, and swelling in your throat. If you were prescribed an antibiotic medicine, take it as told by your doctor. Do not stop taking the antibiotic even if you start to feel better. Most of the time, pharyngitis gets better on its  own. Sometimes, you may need medicine. This information is not intended to replace advice given to you by your health care provider. Make sure you discuss any questions you have with your healthcare provider. Document Revised: 08/26/2020 Document Reviewed: 08/27/2020 Elsevier Patient Education  2022 [employer] Otitis Media, Adult  Otitis media is a condition in which the middle ear is red and swollen (inflamed) and full of fluid. The middle ear is the part of the ear that contains bones for hearing as well as air that helps send sounds to the brain. The conditionusually goes away on its own. What are the causes? This condition is caused by a blockage in the eustachian tube. The eustachian tube connects the middle ear to the back of the nose. It normally allows air into the middle ear. The blockage is caused by fluid or swelling. Problems that can cause blockage include: A cold or infection that affects the nose, mouth, or throat. Allergies. An irritant, such as tobacco smoke. Adenoids that have become large. The adenoids are soft tissue located in the back of the throat, behind the nose and the roof of the mouth. Growth or swelling in the upper part of the throat, just behind the nose (nasopharynx). Damage to the ear caused by change in pressure. This is called barotrauma. What are the signs or symptoms? Symptoms of this condition include: Ear pain. Fever. Problems with hearing. Being tired. Fluid leaking from the ear. Ringing in the ear. How is this treated? This condition can  go away on its own within 3-5 days. But if the condition is caused by bacteria or does not go away on its own, or if it keeps coming back, your doctor may: Give you antibiotic medicines. Give you medicines for pain. Follow these instructions at home: Take over-the-counter and prescription medicines only as told by your doctor. If you were prescribed an antibiotic medicine, take it as told by your doctor. Do  not stop taking the antibiotic even if you start to feel better. Keep all follow-up visits as told by your doctor. This is important. Contact a doctor if: You have bleeding from your nose. There is a lump on your neck. You are not feeling better in 5 days. You feel worse instead of better. Get help right away if: You have pain that is not helped with medicine. You have swelling, redness, or pain around your ear. You get a stiff neck. You cannot move part of your face (paralysis). You notice that the bone behind your ear hurts when you touch it. You get a very bad headache. Summary Otitis media means that the middle ear is red, swollen, and full of fluid. This condition usually goes away on its own. If the problem does not go away, treatment may be needed. You may be given medicines to treat the infection or to treat your pain. If you were prescribed an antibiotic medicine, take it as told by your doctor. Do not stop taking the antibiotic even if you start to feel better. Keep all follow-up visits as told by your doctor. This is important. This information is not intended to replace advice given to you by your health care provider. Make sure you discuss any questions you have with your healthcare provider. Document Revised: 08/28/2019 Document Reviewed: 08/28/2019 Elsevier Patient Education  2022 [employer].

## 2021-03-25 NOTE — Progress Notes (Signed)
Acute Office Visit  Subjective:    Patient ID: Rebecca Wallace, female    DOB: 05-31-1986, 35 y.o.   MRN: [id number]  Chief Complaint  Patient presents with   Sore Throat   Cough   Fatigue    HPI Patient is in today for sore throat, generalized malaise, low-grade fever, cough, and body aches. Onset was yesterday. Treatment has included water and Tylenol. She is scheduled to see Dr Kandee Keen, ENT on 04/06/21. She has a past history of chronic allergic rhinitis. She tells me that she had a positive exposure 5-days-ago. 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[place]    GERD (gastroesophageal reflux disease)    History of cervical cancer    Idiopathic hypotension    Insomnia due to other mental disorder    Lyme disease    Major depressive disorder, recurrent, moderate (HCC)    Migraine without aura, not intractable, without status migrainosus    Other idiopathic scoliosis, lumbar region    Premenstrual tension syndrome    Small intestinal bacterial overgrowth    Xerosis cutis     Past Surgical History:  Procedure Laterality Date   ANAL FISSURE REPAIR  01/11/2019   BREAST ENHANCEMENT SURGERY  08/18/2009   CHOLECYSTECTOMY  2018   GANGLION CYST EXCISION     Left hand 2005   KNEE SURGERY Left 12/2017   SINOSCOPY  09/20/2011   TONSILLECTOMY  10/2007    Family History  Problem Relation Age of Onset   Diabetes Mother    Sjogren's syndrome Mother    High blood pressure Mother    Anxiety disorder Mother    Depression Mother    Diabetes Father    High blood pressure Maternal Grandmother    Heart Problems Maternal Grandmother    Heart attack Maternal Grandfather    COPD Other     Social History   Socioeconomic History   Marital status: Single     Spouse name: Not on file   Number of children: Not on file   Years of education: Not on file   Highest education level: Not on file  Occupational History   Occupation: book kTherapist, nutritional Tobacco Use   Smoking status: Former    Packs/day: 1.00    Years: 10.00    Pack years: 10.00    Types: Cigarettes    Quit date: 2016    Years since quitting: 6.4   Smokeless tobacco: Never  Vaping Use   Vaping Use: Every day   Devices: E-cig  Substance and Sexual Activity   Alcohol use: Yes    Comment: Seldom   Drug use: Never   Sexual activity: Not on file  Other Topics Concern   Not on file  Social History Narrative   Not on file   Social Determinants of Health   Financial Resource Strain: Not on file  Food Insecurity: Not on file  Transportation Needs: Not on file  Physical Activity: Not on file  Stress: Not on file  Social Connections: Not on file  Intimate Partner Violence: Not on file    Outpatient Medications Prior to Visit  Medication Sig Dispense Refill   amoxicillin-clavulanate (AUGMENTIN) 875-125 MG tablet Take 1 tablet by mouth 2 (two) times daily. 20 tablet 0   celecoxib (  CELEBREX) 200 MG capsule Take 1 capsule by mouth twice daily 60 capsule 0   cetirizine (ZYRTEC) 10 MG tablet Take 10 mg by mouth daily.     cyclobenzaprine (FLEXERIL) 5 MG tablet TAKE 1 TABLET BY MOUTH THREE TIMES DAILY 270 tablet 2   diazepam (VALIUM) 5 MG tablet TAKE 1 TABLET BY MOUTH AT BEDTIME AS NEEDED FOR  BACK  SPASMS 15 tablet 0   Eszopiclone 3 MG TABS TAKE 1 TABLET BY MOUTH AT BEDTIME 30 tablet 2   fluticasone (FLONASE) 50 MCG/ACT nasal spray Use one to two sprays in each nostril once daily as directed for nasal congestion. 16 g 5   LORazepam (ATIVAN) 0.5 MG tablet TAKE 1 TABLET BY MOUTH TWICE DAILY AS NEEDED FOR ANXIETY (PLEASE CALL AND SET UP A CHRONIC FOLLOW UP APPOINTMENT) 60 tablet 3   Magnesium Oxide 500 MG TABS Take 4 tablets by mouth at bedtime.     Multiple Vitamin (MULTIVITAMIN) tablet Take  1 tablet by mouth daily.     promethazine (PHENERGAN) 25 MG tablet TAKE 1 TABLET BY MOUTH EVERY 6 HOURS AS NEEDED FOR NAUSEA AND VOMITING 40 tablet 0   VELIVET 0.1/0.125/0.15 -0.025 MG tablet Take 1 tablet by mouth daily.     venlafaxine XR (EFFEXOR-XR) 37.5 MG 24 hr capsule Take 2 capsules by mouth once daily 180 capsule 2   No facility-administered medications prior to visit.    No Known Allergies  Review of Systems  Constitutional:  Positive for fatigue.  HENT:  Positive for ear pain and sore throat.   Respiratory:  Positive for cough and shortness of breath.   Gastrointestinal:  Positive for nausea.  Neurological:  Positive for headaches.      Objective:    Physical Exam Vitals reviewed.  Constitutional:      Appearance: Normal appearance.  HENT:     Right Ear: Tenderness present. Tympanic membrane is erythematous.     Left Ear: Tenderness present. Tympanic membrane is erythematous.     Nose: Congestion present.     Mouth/Throat:     Pharynx: Posterior oropharyngeal erythema present.  Cardiovascular:     Rate and Rhythm: Normal rate and regular rhythm.     Pulses: Normal pulses.     Heart sounds: Normal heart sounds.  Pulmonary:     Effort: Pulmonary effort is normal.     Breath sounds: Normal breath sounds.  Abdominal:     General: Bowel sounds are normal.     Palpations: Abdomen is soft.  Musculoskeletal:        General: Normal range of motion.     Cervical back: Neck supple.  Skin:    General: Skin is warm and dry.     Capillary Refill: Capillary refill takes less than 2 seconds.  Neurological:     General: No focal deficit present.     Mental Status: She is alert and oriented to person, place, and time.  Psychiatric:        Mood and Affect: Mood normal.        Behavior: Behavior normal.    BP 104/60   Pulse 72   Temp (!) 97.5 F (36.4 C)   Resp 16   Wt 134 lb 6.4 oz (61 kg)   BMI 21.69 kg/m  Wt Readings from Last 3 Encounters:  03/25/21 134 lb  6.4 oz (61 kg)  03/15/21 131 lb (59.4 kg)  02/21/21 131 lb (59.4 kg)    Health Maintenance Due  Topic Date  Due   COVID-19 Vaccine (1) Never done   HIV Screening  Never done   Hepatitis C Screening  Never done   TETANUS/TDAP  Never done   PAP SMEAR-Modifier  Never done      Lab Results  Component Value Date   TSH 0.907 02/04/2021   Lab Results  Component Value Date   WBC 6.1 02/04/2021   HGB 14.2 02/04/2021   HCT 42.9 02/04/2021   MCV 99 (H) 02/04/2021   PLT 270 02/04/2021   Lab Results  Component Value Date   NA 134 02/04/2021   K 4.7 02/04/2021   CO2 23 02/04/2021   GLUCOSE 83 02/04/2021   BUN 14 02/04/2021   CREATININE 0.86 02/04/2021   BILITOT 0.3 02/04/2021   ALKPHOS 44 02/04/2021   AST 21 02/04/2021   ALT 15 02/04/2021   PROT 6.5 02/04/2021   ALBUMIN 4.4 02/04/2021   CALCIUM 9.4 02/04/2021   EGFR 91 02/04/2021   Lab Results  Component Value Date   CHOL 156 02/04/2021   Lab Results  Component Value Date   HDL 84 02/04/2021   Lab Results  Component Value Date   LDLCALC 55 02/04/2021   Lab Results  Component Value Date   TRIG 93 02/04/2021   Lab Results  Component Value Date   CHOLHDL 1.9 02/04/2021   No results found for: HGBA1C     Assessment & Plan:    1. Non-recurrent acute allergic otitis media of both ears - cefdinir (OMNICEF) 300 MG capsule; Take 1 capsule (300 mg total) by mouth 2 (two) times daily.  Dispense: 14 capsule; Refill: 0  2. Pharyngitis, unspecified etiology - cefdinir (OMNICEF) 300 MG capsule; Take 1 capsule (300 mg total) by mouth 2 (two) times daily.  Dispense: 14 capsule; Refill: 0  3. Sore throat - POCT rapid strep A  4. Cough in adult - POC COVID-19 BinaxNow  5. History of candidiasis of vagina - fluconazole (DIFLUCAN) 150 MG tablet; Take 1 tablet (150 mg total) by mouth once for 1 dose.  Dispense: 1 tablet; Refill: 0       Take Cefdinir twice daily for 7 days Warm salt water gargles and throat  lozenges for sore throat Rest and push fluids Tylenol or Ibuprofen for pain Keep scheduled appointment with ENT    Follow-up: PRN  An After Visit Summary was printed and given to the patient.  Rip Harbour, NP [facility] [phone]

## 2021-04-04 ENCOUNTER — Other Ambulatory Visit: Payer: Self-pay | Admitting: Family Medicine

## 2021-04-06 DIAGNOSIS — J320 Chronic maxillary sinusitis: Secondary | ICD-10-CM | POA: Diagnosis not present

## 2021-04-22 DIAGNOSIS — J320 Chronic maxillary sinusitis: Secondary | ICD-10-CM | POA: Diagnosis not present

## 2021-04-27 DIAGNOSIS — J320 Chronic maxillary sinusitis: Secondary | ICD-10-CM | POA: Diagnosis not present

## 2021-04-30 ENCOUNTER — Other Ambulatory Visit: Payer: Self-pay | Admitting: Family Medicine

## 2021-05-04 DIAGNOSIS — M542 Cervicalgia: Secondary | ICD-10-CM | POA: Diagnosis not present

## 2021-05-04 DIAGNOSIS — M5451 Vertebrogenic low back pain: Secondary | ICD-10-CM | POA: Diagnosis not present

## 2021-05-04 DIAGNOSIS — M9903 Segmental and somatic dysfunction of lumbar region: Secondary | ICD-10-CM | POA: Diagnosis not present

## 2021-05-04 DIAGNOSIS — M9901 Segmental and somatic dysfunction of cervical region: Secondary | ICD-10-CM | POA: Diagnosis not present

## 2021-05-11 DIAGNOSIS — M542 Cervicalgia: Secondary | ICD-10-CM | POA: Diagnosis not present

## 2021-05-11 DIAGNOSIS — M9903 Segmental and somatic dysfunction of lumbar region: Secondary | ICD-10-CM | POA: Diagnosis not present

## 2021-05-11 DIAGNOSIS — M5451 Vertebrogenic low back pain: Secondary | ICD-10-CM | POA: Diagnosis not present

## 2021-05-11 DIAGNOSIS — M9901 Segmental and somatic dysfunction of cervical region: Secondary | ICD-10-CM | POA: Diagnosis not present

## 2021-05-12 DIAGNOSIS — J310 Chronic rhinitis: Secondary | ICD-10-CM | POA: Diagnosis not present

## 2021-05-24 ENCOUNTER — Other Ambulatory Visit: Payer: Self-pay | Admitting: Family Medicine

## 2021-05-24 MED ORDER — ESZOPICLONE 3 MG PO TABS: 3.0000 mg | ORAL_TABLET | Freq: Every day | ORAL | 2 refills | Status: DC

## 2021-05-24 MED ORDER — DIAZEPAM 5 MG PO TABS: 5.0000 mg | ORAL_TABLET | Freq: Every evening | ORAL | 0 refills | Status: DC | PRN

## 2021-05-25 DIAGNOSIS — M5416 Radiculopathy, lumbar region: Secondary | ICD-10-CM | POA: Diagnosis not present

## 2021-05-26 ENCOUNTER — Other Ambulatory Visit: Payer: Self-pay | Admitting: Physician Assistant

## 2021-06-03 ENCOUNTER — Other Ambulatory Visit: Payer: Self-pay

## 2021-06-03 MED ORDER — VENLAFAXINE HCL 75 MG PO TABS: 75.0000 mg | ORAL_TABLET | Freq: Every day | ORAL | 2 refills | Status: DC

## 2021-06-09 DIAGNOSIS — K5900 Constipation, unspecified: Secondary | ICD-10-CM | POA: Diagnosis not present

## 2021-06-09 DIAGNOSIS — R110 Nausea: Secondary | ICD-10-CM | POA: Diagnosis not present

## 2021-06-09 DIAGNOSIS — K6389 Other specified diseases of intestine: Secondary | ICD-10-CM | POA: Diagnosis not present

## 2021-06-14 DIAGNOSIS — M5416 Radiculopathy, lumbar region: Secondary | ICD-10-CM | POA: Diagnosis not present

## 2021-06-28 DIAGNOSIS — Z01419 Encounter for gynecological examination (general) (routine) without abnormal findings: Secondary | ICD-10-CM | POA: Diagnosis not present

## 2021-06-28 DIAGNOSIS — M5416 Radiculopathy, lumbar region: Secondary | ICD-10-CM | POA: Diagnosis not present

## 2021-06-30 ENCOUNTER — Telehealth: Payer: Self-pay

## 2021-06-30 NOTE — Telephone Encounter (Signed)
Pt left VM requesting change in lunesta or increase in dose. States she has been waking more and not getting enough sleep. Call back #: [phone]  Lorita Officer, CCMA 06/30/21 4:01 PM

## 2021-07-01 NOTE — Telephone Encounter (Signed)
Called pt. Made appointment for 9/27.

## 2021-07-05 ENCOUNTER — Ambulatory Visit: Payer: BC Managed Care – PPO | Admitting: Family Medicine

## 2021-07-05 ENCOUNTER — Other Ambulatory Visit: Payer: Self-pay

## 2021-07-05 ENCOUNTER — Encounter: Payer: Self-pay | Admitting: Family Medicine

## 2021-07-05 VITALS — BP 108/78 | HR 99 | Temp 97.2°F | Ht 66.0 in | Wt 129.0 lb

## 2021-07-05 DIAGNOSIS — Z113 Encounter for screening for infections with a predominantly sexual mode of transmission: Secondary | ICD-10-CM

## 2021-07-05 DIAGNOSIS — J302 Other seasonal allergic rhinitis: Secondary | ICD-10-CM

## 2021-07-05 DIAGNOSIS — M249 Joint derangement, unspecified: Secondary | ICD-10-CM

## 2021-07-05 DIAGNOSIS — J3089 Other allergic rhinitis: Secondary | ICD-10-CM | POA: Diagnosis not present

## 2021-07-05 DIAGNOSIS — F5101 Primary insomnia: Secondary | ICD-10-CM | POA: Diagnosis not present

## 2021-07-05 DIAGNOSIS — F330 Major depressive disorder, recurrent, mild: Secondary | ICD-10-CM

## 2021-07-05 DIAGNOSIS — M62830 Muscle spasm of back: Secondary | ICD-10-CM

## 2021-07-05 MED ORDER — BELSOMRA 15 MG PO TABS: 15.0000 mg | ORAL_TABLET | Freq: Every evening | ORAL | 5 refills | Status: DC | PRN

## 2021-07-05 NOTE — Progress Notes (Signed)
Subjective:  Patient ID: Rebecca Wallace, female    DOB: January 27, 1986  Age: 35 y.o. MRN: [id number]  Chief Complaint  Patient presents with   Insomnia    HPI Insomnia- Currently taking Eszopiclone 3 mg at bedtime c/o being able to fall asleep but will not stay asleep. Change in her sleeping pattern changed within the last 2 weeks. Waking up 5-6 times through out the night. Avoids caffeine in the evenings, only drinks coffee in the a.m. would like to increase her current dose or change her medication.  Has been on ambien, belsomra.  Pt is concerned because she is never been screened for STDs.  She is currently not sexually active.  She and her boyfriend broke up approximately 9 months ago.    Patient has chronic muscular issues.  Patient has a lot of muscle spasms.  When very severe she takes Valium.  On a regular basis however she takes Celebrex 200 mg twice daily and Flexeril 5 mg 3 times daily.    Depression with mild anxiety: Currently on Effexor XR 75 mg once daily and lorazepam 0.5 mg twice daily as needed for severe anxiety.  Allergic rhinitis/allergic conjunctivitis: Currently on Zyrtec 10 mg once daily, Flonase nasal spray 1 to 2 sprays each nostril once daily. Current Outpatient Medications on File Prior to Visit  Medication Sig Dispense Refill   celecoxib (CELEBREX) 200 MG capsule Take 1 capsule by mouth twice daily 60 capsule 0   cetirizine (ZYRTEC) 10 MG tablet Take 10 mg by mouth daily.     cyclobenzaprine (FLEXERIL) 5 MG tablet TAKE 1 TABLET BY MOUTH THREE TIMES DAILY 270 tablet 0   diazepam (VALIUM) 5 MG tablet Take 1 tablet (5 mg total) by mouth at bedtime as needed for muscle spasms. 15 tablet 0   Eszopiclone 3 MG TABS Take 1 tablet (3 mg total) by mouth at bedtime. Take immediately before bedtime 30 tablet 2   fluticasone (FLONASE) 50 MCG/ACT nasal spray Use one to two sprays in each nostril once daily as directed for nasal congestion. 16 g 5   LORazepam (ATIVAN) 0.5 MG  tablet TAKE 1 TABLET BY MOUTH TWICE DAILY AS NEEDED FOR ANXIETY (PLEASE CALL AND SET UP A CHRONIC FOLLOW UP APPOINTMENT) 60 tablet 3   Magnesium Oxide 500 MG TABS Take 4 tablets by mouth at bedtime.     Multiple Vitamin (MULTIVITAMIN) tablet Take 1 tablet by mouth daily.     promethazine (PHENERGAN) 25 MG tablet TAKE 1 TABLET BY MOUTH EVERY 6 HOURS AS NEEDED FOR NAUSEA AND VOMITING 20 tablet 0   VELIVET 0.1/0.125/0.15 -0.025 MG tablet Take 1 tablet by mouth daily.     venlafaxine (EFFEXOR) 75 MG tablet Take 1 tablet (75 mg total) by mouth daily. 90 tablet 2   No current facility-administered medications on file prior to visit.   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)    GERD (gastroesophageal reflux disease)    History of cervical cancer    Idiopathic hypotension    Insomnia due to other mental disorder    Lyme disease    Major depressive disorder, recurrent, moderate (HCC)    Migraine without aura, not intractable, without status migrainosus    Other idiopathic scoliosis, lumbar region    Premenstrual tension syndrome    Small intestinal bacterial overgrowth    Xerosis cutis    Past Surgical History:  Procedure Laterality Date  ANAL FISSURE REPAIR  01/11/2019   BREAST ENHANCEMENT SURGERY  08/18/2009   CHOLECYSTECTOMY  2018   GANGLION CYST EXCISION     Left hand 2005   KNEE SURGERY Left 12/2017   SINOSCOPY  09/20/2011   TONSILLECTOMY  10/2007    Family History  Problem Relation Age of Onset   Diabetes Mother    Sjogren's syndrome Mother    High blood pressure Mother    Anxiety disorder Mother    Depression Mother    Diabetes Father    High blood pressure Maternal Grandmother    Heart Problems Maternal Grandmother    Heart attack Maternal Grandfather    COPD Other    Social History   Socioeconomic History   Marital status: Single    Spouse name: Not on file   Number of children: Not on file   Years  of education: Not on file   Highest education level: Not on file  Occupational History   Occupation: book Biomedical engineer  Tobacco Use   Smoking status: Former    Packs/day: 1.00    Years: 10.00    Pack years: 10.00    Types: Cigarettes    Quit date: 2016    Years since quitting: 6.7   Smokeless tobacco: Never  Vaping Use   Vaping Use: Every day   Devices: E-cig  Substance and Sexual Activity   Alcohol use: Yes    Comment: Seldom   Drug use: Never   Sexual activity: Not Currently  Other Topics Concern   Not on file  Social History Narrative   Not on file   Social Determinants of Health   Financial Resource Strain: Not on file  Food Insecurity: Not on file  Transportation Needs: Not on file  Physical Activity: Not on file  Stress: Not on file  Social Connections: Not on file    Review of Systems  Constitutional:  Negative for chills, fatigue and fever.  HENT:  Negative for congestion, ear pain, rhinorrhea and sore throat.   Respiratory:  Negative for cough and shortness of breath.   Cardiovascular:  Negative for chest pain.  Gastrointestinal:  Positive for nausea. Negative for abdominal pain, constipation, diarrhea and vomiting.  Genitourinary:  Negative for dysuria and urgency.  Musculoskeletal:  Positive for back pain and myalgias.  Neurological:  Negative for dizziness, weakness, light-headedness and headaches.  Psychiatric/Behavioral:  Positive for sleep disturbance. Negative for dysphoric mood. The patient is not nervous/anxious.     Objective:  BP 108/78   Pulse 99   Temp (!) 97.2 F (36.2 C)   Ht 5\' 6"  (1.676 m)   Wt 129 lb (58.5 kg)   LMP 06/29/2021   SpO2 99%   BMI 20.82 kg/m   BP/Weight 07/05/2021 03/25/2021 03/15/2021  Systolic BP 108 104 108  Diastolic BP 78 60 82  Wt. (Lbs) 129 134.4 131  BMI 20.82 21.69 21.14    Physical Exam Vitals reviewed.  Constitutional:      Appearance: Normal appearance. She is normal weight.  Neck:     Vascular: No  carotid bruit.  Cardiovascular:     Rate and Rhythm: Normal rate and regular rhythm.     Pulses: Normal pulses.     Heart sounds: Normal heart sounds.  Pulmonary:     Effort: Pulmonary effort is normal. No respiratory distress.     Breath sounds: Normal breath sounds.  Abdominal:     General: Abdomen is flat. Bowel sounds are normal.  Palpations: Abdomen is soft.     Tenderness: There is no abdominal tenderness.  Musculoskeletal:     Comments: Brighton scale 5/9. Pt is hypermobile  Neurological:     Mental Status: She is alert and oriented to person, place, and time.  Psychiatric:        Mood and Affect: Mood normal.        Behavior: Behavior normal.    Diabetic Foot Exam - Simple   No data filed      Lab Results  Component Value Date   WBC 6.1 02/04/2021   HGB 14.2 02/04/2021   HCT 42.9 02/04/2021   PLT 270 02/04/2021   GLUCOSE 83 02/04/2021   CHOL 156 02/04/2021   TRIG 93 02/04/2021   HDL 84 02/04/2021   LDLCALC 55 02/04/2021   ALT 15 02/04/2021   AST 21 02/04/2021   NA 134 02/04/2021   K 4.7 02/04/2021   CL 100 02/04/2021   CREATININE 0.86 02/04/2021   BUN 14 02/04/2021   CO2 23 02/04/2021   TSH 0.907 02/04/2021      Assessment & Plan:   Problem List Items Addressed This Visit       Respiratory   Seasonal and perennial allergic rhinitis - Primary    Continue Zyrtec and Flonase.        Other   Depression, major, recurrent, mild (HCC) (Chronic)    Well-controlled.   Continue Effexor 75 mg once daily in a.m. Continue lorazepam 0.5 mg twice daily as needed severe anxiety.      Spasm of back muscles    Patient uses Flexeril regularly. Has Celebrex as well. Uses diazepam if has severe spasms.      Primary insomnia    Stop Lunesta (failed). Failed on Ambien in the past. Belsomra had worked in the past however was very expensive with her insurance.  Will restart Belsomra 15 mg once daily at night.  Samples given.  Hopefully cost has  decreased.      Screen for sexually transmitted diseases   Relevant Orders   HSV(herpes simplex vrs) 1+2 ab-IgG (Completed)   HCV Ab w Reflex to Quant PCR (Completed)   HIV Antibody (routine testing w rflx) (Completed)   Hypermobile joints    Prone to muscle spasms. Continue Flexeril 5 mg 3 times daily. Use diazepam for severe muscle spasm.     .  Meds ordered this encounter  Medications   Suvorexant (BELSOMRA) 15 MG TABS    Sig: Take 15 mg by mouth at bedtime as needed.    Dispense:  30 tablet    Refill:  5    Orders Placed This Encounter  Procedures   HSV(herpes simplex vrs) 1+2 ab-IgG   HCV Ab w Reflex to Quant PCR   HIV Antibody (routine testing w rflx)   Interpretation:   HSV-2 IgG Supplemental Test     Follow-up: Return in about 4 weeks (around 08/02/2021). For insomnia. An After Visit Summary was printed and given to the patient.  Blane Ohara, MD [facility] [phone]

## 2021-07-05 NOTE — Patient Instructions (Signed)
Start on Belsomra 15 mg once daily at night.  Stop lunesta.

## 2021-07-07 LAB — HIV ANTIBODY (ROUTINE TESTING W REFLEX): HIV Screen 4th Generation wRfx: NONREACTIVE

## 2021-07-07 LAB — HSV(HERPES SIMPLEX VRS) I + II AB-IGG
HSV 1 Glycoprotein G Ab, IgG: <0.91 index (ref 0.00–0.90)
HSV 2 IgG, Type Spec: 1.13 index — ABNORMAL HIGH (ref 0.00–0.90)

## 2021-07-07 LAB — HCV AB W REFLEX TO QUANT PCR: HCV Ab: <0.1 s/co ratio (ref 0.0–0.9)

## 2021-07-09 ENCOUNTER — Other Ambulatory Visit: Payer: Self-pay | Admitting: Legal Medicine

## 2021-07-09 ENCOUNTER — Other Ambulatory Visit: Payer: Self-pay | Admitting: Family Medicine

## 2021-07-09 ENCOUNTER — Encounter: Payer: Self-pay | Admitting: Family Medicine

## 2021-07-09 DIAGNOSIS — Z113 Encounter for screening for infections with a predominantly sexual mode of transmission: Secondary | ICD-10-CM | POA: Insufficient documentation

## 2021-07-09 DIAGNOSIS — F419 Anxiety disorder, unspecified: Secondary | ICD-10-CM

## 2021-07-09 DIAGNOSIS — M249 Joint derangement, unspecified: Secondary | ICD-10-CM | POA: Insufficient documentation

## 2021-07-09 DIAGNOSIS — F5101 Primary insomnia: Secondary | ICD-10-CM | POA: Insufficient documentation

## 2021-07-09 NOTE — Assessment & Plan Note (Signed)
Stop Lunesta (failed). Failed on Ambien in the past. Belsomra had worked in the past however was very expensive with her insurance.  Will restart Belsomra 15 mg once daily at night.  Samples given.  Hopefully cost has decreased.

## 2021-07-09 NOTE — Assessment & Plan Note (Signed)
Patient uses Flexeril regularly. Has Celebrex as well. Uses diazepam if has severe spasms.

## 2021-07-09 NOTE — Assessment & Plan Note (Signed)
Prone to muscle spasms. Continue Flexeril 5 mg 3 times daily. Use diazepam for severe muscle spasm.

## 2021-07-09 NOTE — Assessment & Plan Note (Signed)
Well-controlled.   Continue Effexor 75 mg once daily in a.m. Continue lorazepam 0.5 mg twice daily as needed severe anxiety.

## 2021-07-09 NOTE — Assessment & Plan Note (Signed)
Continue Zyrtec and Flonase.

## 2021-07-11 ENCOUNTER — Other Ambulatory Visit: Payer: Self-pay

## 2021-07-11 NOTE — Telephone Encounter (Signed)
Patient called and stated that the Suvorexant is too expensive, insurance won't cover and would like to see if she could try something else cheaper.  Thank you!

## 2021-07-12 ENCOUNTER — Other Ambulatory Visit: Payer: Self-pay | Admitting: Family Medicine

## 2021-07-12 MED ORDER — RAMELTEON 8 MG PO TABS: 8.0000 mg | ORAL_TABLET | Freq: Every day | ORAL | 2 refills | Status: DC

## 2021-07-21 DIAGNOSIS — M48061 Spinal stenosis, lumbar region without neurogenic claudication: Secondary | ICD-10-CM | POA: Diagnosis not present

## 2021-07-21 DIAGNOSIS — M5416 Radiculopathy, lumbar region: Secondary | ICD-10-CM | POA: Diagnosis not present

## 2021-08-04 ENCOUNTER — Encounter: Payer: Self-pay | Admitting: Family Medicine

## 2021-08-04 ENCOUNTER — Ambulatory Visit: Payer: BC Managed Care – PPO | Admitting: Family Medicine

## 2021-08-04 ENCOUNTER — Other Ambulatory Visit: Payer: Self-pay

## 2021-08-04 VITALS — BP 118/64 | HR 88 | Temp 97.2°F | Ht 66.0 in | Wt 127.0 lb

## 2021-08-04 DIAGNOSIS — M419 Scoliosis, unspecified: Secondary | ICD-10-CM | POA: Diagnosis not present

## 2021-08-04 DIAGNOSIS — F5101 Primary insomnia: Secondary | ICD-10-CM

## 2021-08-04 DIAGNOSIS — Z23 Encounter for immunization: Secondary | ICD-10-CM | POA: Diagnosis not present

## 2021-08-04 DIAGNOSIS — M5116 Intervertebral disc disorders with radiculopathy, lumbar region: Secondary | ICD-10-CM | POA: Diagnosis not present

## 2021-08-04 DIAGNOSIS — M5431 Sciatica, right side: Secondary | ICD-10-CM | POA: Diagnosis not present

## 2021-08-04 MED ORDER — RAMELTEON 8 MG PO TABS: 8.0000 mg | ORAL_TABLET | Freq: Every day | ORAL | 3 refills | Status: DC

## 2021-08-04 NOTE — Progress Notes (Signed)
Subjective:  Patient ID: Rebecca Wallace, female    DOB: [birth year]  Age: 35 y.o. MRN: [id number]  Chief Complaint  Patient presents with   Insomnia    HPI   Patient presents for 4 week f/u for insomnia patient reports since starting Rozerem she has been able to fall and stay asleep without any issues. We initially had changed her to belsomra, but it was very expensive.  Current Outpatient Medications on File Prior to Visit  Medication Sig Dispense Refill   celecoxib (CELEBREX) 200 MG capsule Take 1 capsule by mouth twice daily 60 capsule 0   cetirizine (ZYRTEC) 10 MG tablet Take 10 mg by mouth daily.     cyclobenzaprine (FLEXERIL) 5 MG tablet TAKE 1 TABLET BY MOUTH THREE TIMES DAILY 270 tablet 0   diazepam (VALIUM) 5 MG tablet TAKE 1 TABLET BY MOUTH AT BEDTIME AS NEEDED FOR MUSCLE SPASM 15 tablet 0   fluticasone (FLONASE) 50 MCG/ACT nasal spray Use one to two sprays in each nostril once daily as directed for nasal congestion. 16 g 5   LORazepam (ATIVAN) 0.5 MG tablet Take 1 tablet (0.5 mg total) by mouth 2 (two) times daily as needed for anxiety. 60 tablet 2   Magnesium Oxide 500 MG TABS Take 4 tablets by mouth at bedtime.     Multiple Vitamin (MULTIVITAMIN) tablet Take 1 tablet by mouth daily.     promethazine (PHENERGAN) 25 MG tablet TAKE 1 TABLET BY MOUTH EVERY 6 HOURS AS NEEDED FOR NAUSEA AND VOMITING 20 tablet 0   VELIVET 0.1/0.125/0.15 -0.025 MG tablet Take 1 tablet by mouth daily.     venlafaxine (EFFEXOR) 75 MG tablet Take 1 tablet (75 mg total) by mouth daily. 90 tablet 2   No current facility-administered medications on file prior to visit.   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)    GERD (gastroesophageal reflux disease)    History of cervical cancer    Idiopathic hypotension    Insomnia due to other mental disorder    Lyme disease    Major depressive disorder, recurrent, moderate (HCC)    Migraine  without aura, not intractable, without status migrainosus    Other idiopathic scoliosis, lumbar region    Premenstrual tension syndrome    Small intestinal bacterial overgrowth    Xerosis cutis    Past Surgical History:  Procedure Laterality Date   ANAL FISSURE REPAIR  01/11/2019   BREAST ENHANCEMENT SURGERY  08/18/2009   CHOLECYSTECTOMY  2018   GANGLION CYST EXCISION     Left hand 2005   KNEE SURGERY Left 12/2017   SINOSCOPY  09/20/2011   TONSILLECTOMY  10/2007    Family History  Problem Relation Age of Onset   Diabetes Mother    Sjogren's syndrome Mother    High blood pressure Mother    Anxiety disorder Mother    Depression Mother    Diabetes Father    High blood pressure Maternal Grandmother    Heart Problems Maternal Grandmother    Heart attack Maternal Grandfather    COPD Other    Social History   Socioeconomic History   Marital status: Single    Spouse name: Not on file   Number of children: Not on file   Years of education: Not on file   Highest education level: Not on file  Occupational History   Occupation: book Biomedical engineer  Tobacco Use   Smoking  status: Former    Packs/day: 1.00    Years: 10.00    Pack years: 10.00    Types: Cigarettes    Quit date: 2016    Years since quitting: 6.8   Smokeless tobacco: Never  Vaping Use   Vaping Use: Every day   Devices: E-cig  Substance and Sexual Activity   Alcohol use: Yes    Comment: Seldom   Drug use: Never   Sexual activity: Not Currently  Other Topics Concern   Not on file  Social History Narrative   Not on file   Social Determinants of Health   Financial Resource Strain: Not on file  Food Insecurity: Not on file  Transportation Needs: Not on file  Physical Activity: Not on file  Stress: Not on file  Social Connections: Not on file    Review of Systems  Constitutional:  Negative for chills, fatigue and fever.  HENT:  Negative for congestion, ear pain, rhinorrhea and sore throat.   Respiratory:   Negative for cough and shortness of breath.   Cardiovascular:  Negative for chest pain.    Objective:  BP 118/64   Pulse 88   Temp (!) 97.2 F (36.2 C)   Ht 5\' 6"  (1.676 m)   Wt 127 lb (57.6 kg)   SpO2 100%   BMI 20.50 kg/m   BP/Weight 08/04/2021 07/05/2021 03/25/2021  Systolic BP 118 108 104  Diastolic BP 64 78 60  Wt. (Lbs) 127 129 134.4  BMI 20.5 20.82 21.69    Physical Exam Vitals reviewed.  Constitutional:      Appearance: Normal appearance. She is normal weight.  Cardiovascular:     Rate and Rhythm: Normal rate and regular rhythm.     Heart sounds: Normal heart sounds.  Pulmonary:     Effort: Pulmonary effort is normal. No respiratory distress.     Breath sounds: Normal breath sounds.  Neurological:     Mental Status: She is alert and oriented to person, place, and time.  Psychiatric:        Mood and Affect: Mood normal.        Behavior: Behavior normal.   Diabetic Foot Exam - Simple   No data filed      Lab Results  Component Value Date   WBC 6.1 02/04/2021   HGB 14.2 02/04/2021   HCT 42.9 02/04/2021   PLT 270 02/04/2021   GLUCOSE 83 02/04/2021   CHOL 156 02/04/2021   TRIG 93 02/04/2021   HDL 84 02/04/2021   LDLCALC 55 02/04/2021   ALT 15 02/04/2021   AST 21 02/04/2021   NA 134 02/04/2021   K 4.7 02/04/2021   CL 100 02/04/2021   CREATININE 0.86 02/04/2021   BUN 14 02/04/2021   CO2 23 02/04/2021   TSH 0.907 02/04/2021      Assessment & Plan:   Problem List Items Addressed This Visit       Other   Primary insomnia - Primary    The current medical regimen is effective;  continue present plan and medications. Continue rozerem 8 mg once daily at night      Other Visit Diagnoses     Need for immunization against influenza       Relevant Orders   Flu Vaccine QUAD 84mo+IM (Fluarix, Fluzone & Alfiuria Quad PF) (Completed)     .  Meds ordered this encounter  Medications   ramelteon (ROZEREM) 8 MG tablet    Sig: Take 1 tablet (8 mg  total) by mouth at bedtime.    Dispense:  90 tablet    Refill:  3     Orders Placed This Encounter  Procedures   Flu Vaccine QUAD 58mo+IM (Fluarix, Fluzone & Alfiuria Quad PF)      Follow-up: No follow-ups on file.  An After Visit Summary was printed and given to the patient.  Blane Ohara, MD [name] [phone]

## 2021-08-04 NOTE — Assessment & Plan Note (Signed)
The current medical regimen is effective;  continue present plan and medications. Continue rozerem 8 mg once daily at night

## 2021-09-05 ENCOUNTER — Other Ambulatory Visit: Payer: Self-pay | Admitting: Family Medicine

## 2021-09-05 MED ORDER — MIRTAZAPINE 15 MG PO TBDP: 15.0000 mg | ORAL_TABLET | Freq: Every day | ORAL | 2 refills | Status: DC

## 2021-09-05 MED ORDER — MIRTAZAPINE 15 MG PO TABS: 15.0000 mg | ORAL_TABLET | Freq: Every day | ORAL | 2 refills | Status: DC

## 2021-09-07 DIAGNOSIS — M5416 Radiculopathy, lumbar region: Secondary | ICD-10-CM | POA: Diagnosis not present

## 2021-09-08 ENCOUNTER — Other Ambulatory Visit: Payer: Self-pay | Admitting: Family Medicine

## 2021-09-12 ENCOUNTER — Telehealth: Payer: Self-pay

## 2021-09-12 ENCOUNTER — Other Ambulatory Visit: Payer: Self-pay | Admitting: Family Medicine

## 2021-09-12 MED ORDER — MIRTAZAPINE 7.5 MG PO TABS: 7.5000 mg | ORAL_TABLET | Freq: Every day | ORAL | 2 refills | Status: DC

## 2021-09-12 NOTE — Telephone Encounter (Signed)
Called pt. Pt VU.   Lorita Officer, CCMA 09/12/21 4:10 PM

## 2021-09-12 NOTE — Telephone Encounter (Signed)
Sent mirtazepine 7.5 mg qhs.  Recommend increase water intake and take metamucil  kc

## 2021-09-12 NOTE — Telephone Encounter (Signed)
Patient calling as last night she had to return to taking ramelton 8 mg. She was taking mirtazapine 15 mg but caused abd discomfort and constipation. Pt was constipated for 3 days before taking laxative. She did express mirtazapine was more effective but constipation "is not worth it." Stated originally changed due to expense of medication. Please advise next step.   Lorita Officer, [place] 09/12/21 8:23 AM

## 2021-09-14 NOTE — Telephone Encounter (Signed)
LMTC. Stop mirtazepine.  Recommend miralax bid as directed on bottle May continue metamucil.  Recommend call back tomorrow about sleep medicines.  When she calls back, please review all sleep medicines she has been on.  She may very well need to come be seen for insomnia as we have tried numerous medications for her.

## 2021-09-14 NOTE — Telephone Encounter (Signed)
Patient calling back and has been on lower dosage since Monday night. She has not been able to have BM since. She is in pain and bloated. Pt did try water increase and metamucil. She is requesting different medication.   Lorita Officer, CCMA 09/14/21 1:45 PM

## 2021-09-15 ENCOUNTER — Other Ambulatory Visit: Payer: Self-pay | Admitting: Family Medicine

## 2021-09-15 MED ORDER — ESZOPICLONE 3 MG PO TABS: 3.0000 mg | ORAL_TABLET | Freq: Every day | ORAL | 2 refills | Status: DC

## 2021-09-15 NOTE — Telephone Encounter (Signed)
Called pt. Patient has previously been on the following: Belsomra- too expensive Rozerem- too expensive Mirtazpine- constipation Lunesta- believes this helped but not well, willing to try again.   She is unsure of what insurance will pay. If would need to be seen she would need to be seen today per pt.   Lorita Officer, CCMA 09/15/21 8:01 AM

## 2021-09-16 NOTE — Telephone Encounter (Signed)
Called pt. She will pick up lunesta.   Lorita Officer, CCMA 09/16/21 8:06 AM

## 2021-09-21 ENCOUNTER — Telehealth (INDEPENDENT_AMBULATORY_CARE_PROVIDER_SITE_OTHER): Payer: BC Managed Care – PPO | Admitting: Nurse Practitioner

## 2021-09-21 ENCOUNTER — Encounter: Payer: Self-pay | Admitting: Nurse Practitioner

## 2021-09-21 VITALS — Ht 66.0 in | Wt 128.0 lb

## 2021-09-21 DIAGNOSIS — R509 Fever, unspecified: Secondary | ICD-10-CM

## 2021-09-21 DIAGNOSIS — U071 COVID-19: Secondary | ICD-10-CM | POA: Diagnosis not present

## 2021-09-21 LAB — POCT INFLUENZA A/B
Influenza A, POC: NEGATIVE
Influenza B, POC: NEGATIVE

## 2021-09-21 LAB — POC COVID19 BINAXNOW: SARS Coronavirus 2 Ag: POSITIVE — AB

## 2021-09-21 MED ORDER — MOLNUPIRAVIR 200 MG PO CAPS: 4.0000 | ORAL_CAPSULE | Freq: Two times a day (BID) | ORAL | 0 refills | Status: DC

## 2021-09-21 NOTE — Progress Notes (Signed)
Virtual Visit via Telephone Note   This visit type was conducted due to national recommendations for restrictions regarding the COVID-19 Pandemic (e.g. social distancing) in an effort to limit this patient's exposure and mitigate transmission in our community.  Due to her co-morbid illnesses, this patient is at least at moderate risk for complications without adequate follow up.  This format is felt to be most appropriate for this patient at this time.  The patient did not have access to video technology/had technical difficulties with video requiring transitioning to audio format only (telephone).  All issues noted in this document were discussed and addressed.  No physical exam could be performed with this format.  Patient verbally consented to a telehealth visit.   Date:  09/21/2021   ID:  Rebecca Wallace, DOB 08/10/1986, MRN [id number]  Patient Location: Other:  [facility] parking log Provider Location: Office  PCP:  Blane Ohara, MD   Evaluation Performed:  Established patient, acute telemedicine visit  Chief Complaint:  Fever  History of Present Illness:    Rebecca Wallace is a 35 y.o. female with Upper respiratory symptoms She complains of achiness, congestion, fever 101.0, nasal congestion, non productive cough, post nasal drip, and chills. Onset of symptoms was yesterday and worsening. Treatment has included Ibuprofen and pushing fluids. Past history is significant for occasional episodes of bronchitis. Patient is former smoker.   The patient does have symptoms concerning for COVID-19 infection (fever, chills, cough, or new shortness of breath).    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)    GERD (gastroesophageal reflux disease)    History of cervical cancer    Idiopathic hypotension    Insomnia due to other mental disorder    Lyme disease    Major depressive disorder, recurrent, moderate  (HCC)    Migraine without aura, not intractable, without status migrainosus    Other idiopathic scoliosis, lumbar region    Premenstrual tension syndrome    Small intestinal bacterial overgrowth    Xerosis cutis     Past Surgical History:  Procedure Laterality Date   ANAL FISSURE REPAIR  01/11/2019   BREAST ENHANCEMENT SURGERY  08/18/2009   CHOLECYSTECTOMY  2018   GANGLION CYST EXCISION     Left hand 2005   KNEE SURGERY Left 12/2017   SINOSCOPY  09/20/2011   TONSILLECTOMY  10/2007    Family History  Problem Relation Age of Onset   Diabetes Mother    Sjogren's syndrome Mother    High blood pressure Mother    Anxiety disorder Mother    Depression Mother    Diabetes Father    High blood pressure Maternal Grandmother    Heart Problems Maternal Grandmother    Heart attack Maternal Grandfather    COPD Other     Social History   Socioeconomic History   Marital status: Single    Spouse name: Not on file   Number of children: Not on file   Years of education: Not on file   Highest education level: Not on file  Occupational History   Occupation: book Biomedical engineer  Tobacco Use   Smoking status: Former    Packs/day: 1.00    Years: 10.00    Pack years: 10.00    Types: Cigarettes    Quit date: 2016    Years since quitting: 6.9   Smokeless tobacco: Never  Vaping Use   Vaping Use: Every day  Devices: E-cig  Substance and Sexual Activity   Alcohol use: Yes    Comment: Seldom   Drug use: Never   Sexual activity: Not Currently  Other Topics Concern   Not on file  Social History Narrative   Not on file   Social Determinants of Health   Financial Resource Strain: Not on file  Food Insecurity: Not on file  Transportation Needs: Not on file  Physical Activity: Not on file  Stress: Not on file  Social Connections: Not on file  Intimate Partner Violence: Not on file    Outpatient Medications Prior to Visit  Medication Sig Dispense  Refill   celecoxib (CELEBREX) 200 MG capsule Take 1 capsule by mouth twice daily 60 capsule 0   cetirizine (ZYRTEC) 10 MG tablet Take 10 mg by mouth daily.     cyclobenzaprine (FLEXERIL) 5 MG tablet TAKE 1 TABLET BY MOUTH THREE TIMES DAILY 270 tablet 0   diazepam (VALIUM) 5 MG tablet TAKE 1 TABLET BY MOUTH AT BEDTIME AS NEEDED FOR MUSCLE SPASM 15 tablet 0   eszopiclone 3 MG TABS Take 1 tablet (3 mg total) by mouth at bedtime. Take immediately before bedtime 30 tablet 2   fluticasone (FLONASE) 50 MCG/ACT nasal spray Use one to two sprays in each nostril once daily as directed for nasal congestion. 16 g 5   LORazepam (ATIVAN) 0.5 MG tablet Take 1 tablet (0.5 mg total) by mouth 2 (two) times daily as needed for anxiety. 60 tablet 2   Magnesium Oxide 500 MG TABS Take 4 tablets by mouth at bedtime.     mirtazapine (REMERON) 7.5 MG tablet Take 1 tablet (7.5 mg total) by mouth at bedtime. 30 tablet 2   Multiple Vitamin (MULTIVITAMIN) tablet Take 1 tablet by mouth daily.     promethazine (PHENERGAN) 25 MG tablet TAKE 1 TABLET BY MOUTH EVERY 6 HOURS AS NEEDED FOR NAUSEA AND VOMITING 20 tablet 0   ramelteon (ROZEREM) 8 MG tablet Take 1 tablet (8 mg total) by mouth at bedtime. 90 tablet 3   VELIVET 0.1/0.125/0.15 -0.025 MG tablet Take 1 tablet by mouth daily.     venlafaxine (EFFEXOR) 75 MG tablet Take 1 tablet (75 mg total) by mouth daily. 90 tablet 2   No facility-administered medications prior to visit.    Allergies:   Patient has no known allergies.   Social History   Tobacco Use   Smoking status: Former    Packs/day: 1.00    Years: 10.00    Pack years: 10.00    Types: Cigarettes    Quit date: 2016    Years since quitting: 6.9   Smokeless tobacco: Never  Vaping Use   Vaping Use: Every day   Devices: E-cig  Substance Use Topics   Alcohol use: Yes    Comment: Seldom   Drug use: Never     Review of Systems  Constitutional:  Positive for chills, fever and  malaise/fatigue.  HENT:  Positive for congestion and sore throat. Negative for ear pain and sinus pain.   Respiratory:  Positive for cough. Negative for shortness of breath.   Cardiovascular:  Negative for chest pain.  Gastrointestinal:  Negative for diarrhea, nausea and vomiting.  Musculoskeletal:  Negative for myalgias.       Body aches  Neurological:  Positive for headaches.    Labs/Other Tests and Data Reviewed:    Recent Labs: 02/04/2021: ALT 15; BUN 14; Creatinine, Ser 0.86; Hemoglobin 14.2; Platelets 270; Potassium 4.7; Sodium 134; TSH  0.907   Recent Lipid Panel Lab Results  Component Value Date/Time   CHOL 156 02/04/2021 12:34 PM   TRIG 93 02/04/2021 12:34 PM   HDL 84 02/04/2021 12:34 PM   CHOLHDL 1.9 02/04/2021 12:34 PM   LDLCALC 55 02/04/2021 12:34 PM    Wt Readings from Last 3 Encounters:  09/21/21 128 lb (58.1 kg)  08/04/21 127 lb (57.6 kg)  07/05/21 129 lb (58.5 kg)     Objective:    Vital Signs:  Ht 5\' 6"  (1.676 m)    Wt 128 lb (58.1 kg)    BMI 20.66 kg/m    Physical Exam No physical exam due to telemedicine visit  ASSESSMENT & PLAN:     1. COVID-19 - molnupiravir EUA (LAGEVRIO) 200 MG CAPS capsule; Take 4 capsules (800 mg total) by mouth every 12 (twelve) hours.  Dispense: 40 capsule; Refill: 0  2. Fever, unspecified fever cause - POCT Influenza A/B - POC COVID-19 BinaxNow    Rest and push fluids Seek emergency medical care if symptoms suddenly worsen or become severe Take OTC medications for symptom management No work for 5 days, wear a mask when in public for 5 days after 5 days of quarantine   COVID-19 Education: The signs and symptoms of COVID-19 were discussed with the patient and how to seek care for testing (follow up with PCP or arrange E-visit). The importance of social distancing was discussed today.   I spent 7 minutes dedicated to the care of this patient on the date of this encounter to include face-to-face time with the patient, as  well as: EMR and prescription medication management  Follow Up:  In Person prn  Signed,  , DNP 09/21/2021  at 1:43 PM [facility] [place]

## 2021-10-05 ENCOUNTER — Ambulatory Visit: Payer: BC Managed Care – PPO | Admitting: Family Medicine

## 2021-10-05 VITALS — BP 110/78 | HR 95 | Temp 99.0°F | Ht 66.0 in | Wt 127.0 lb

## 2021-10-05 DIAGNOSIS — J029 Acute pharyngitis, unspecified: Secondary | ICD-10-CM

## 2021-10-05 DIAGNOSIS — J111 Influenza due to unidentified influenza virus with other respiratory manifestations: Secondary | ICD-10-CM | POA: Diagnosis not present

## 2021-10-05 LAB — POCT RAPID STREP A (OFFICE): Rapid Strep A Screen: NEGATIVE

## 2021-10-05 LAB — POC COVID19 BINAXNOW: SARS Coronavirus 2 Ag: NEGATIVE

## 2021-10-05 LAB — POCT INFLUENZA A/B
Influenza A, POC: NEGATIVE
Influenza B, POC: NEGATIVE

## 2021-10-05 MED ORDER — OSELTAMIVIR PHOSPHATE 75 MG PO CAPS: 75.0000 mg | ORAL_CAPSULE | Freq: Two times a day (BID) | ORAL | 0 refills | Status: DC

## 2021-10-05 NOTE — Progress Notes (Signed)
Acute Office Visit  Subjective:    Patient ID: Rebecca Wallace, female    DOB: 02-14-1986, 35 y.o.   MRN: [id number]  Chief Complaint  Patient presents with   Sore Throat    Nasal congestion/bilateral ear pain/fever    HPI: Patient is in today for sore throat, Bl earaches, nasal and chest congestion, rhinorhea, runny eyes, muscle aches, and headaches. Severe fatigue. Present since yesterday.   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[place])    GERD (gastroesophageal reflux disease)    History of cervical cancer    Idiopathic hypotension    Insomnia due to other mental disorder    Lyme disease    Major depressive disorder, recurrent, moderate (HCC)    Migraine without aura, not intractable, without status migrainosus    Other idiopathic scoliosis, lumbar region    Premenstrual tension syndrome    Small intestinal bacterial overgrowth    Xerosis cutis     Past Surgical History:  Procedure Laterality Date   ANAL FISSURE REPAIR  01/11/2019   BREAST ENHANCEMENT SURGERY  08/18/2009   CHOLECYSTECTOMY  2018   GANGLION CYST EXCISION     Left hand 2005   KNEE SURGERY Left 12/2017   SINOSCOPY  09/20/2011   TONSILLECTOMY  10/2007    Family History  Problem Relation Age of Onset   Diabetes Mother    Sjogren's syndrome Mother    High blood pressure Mother    Anxiety disorder Mother    Depression Mother    Diabetes Father    High blood pressure Maternal Grandmother    Heart Problems Maternal Grandmother    Heart attack Maternal Grandfather    COPD Other     Social History   Socioeconomic History   Marital status: Single    Spouse name: Not on file   Number of children: Not on file   Years of education: Not on file   Highest education level: Not on file  Occupational History   Occupation: book Therapist, nutritional  Tobacco Use   Smoking status: Former    Packs/day: 1.00    Years: 10.00    Pack years: 10.00    Types:  Cigarettes    Quit date: 2016    Years since quitting: 7.0   Smokeless tobacco: Never  Vaping Use   Vaping Use: Every day   Devices: E-cig  Substance and Sexual Activity   Alcohol use: Yes    Comment: Seldom   Drug use: Never   Sexual activity: Not Currently  Other Topics Concern   Not on file  Social History Narrative   Not on file   Social Determinants of Health   Financial Resource Strain: Not on file  Food Insecurity: Not on file  Transportation Needs: Not on file  Physical Activity: Not on file  Stress: Not on file  Social Connections: Not on file  Intimate Partner Violence: Not on file    Outpatient Medications Prior to Visit  Medication Sig Dispense Refill   celecoxib (CELEBREX) 200 MG capsule Take 1 capsule by mouth twice daily 60 capsule 0   cetirizine (ZYRTEC) 10 MG tablet Take 10 mg by mouth daily.     cyclobenzaprine (FLEXERIL) 5 MG tablet TAKE 1 TABLET BY MOUTH THREE TIMES DAILY 270 tablet 0   diazepam (VALIUM) 5 MG tablet TAKE 1 TABLET BY MOUTH AT BEDTIME AS NEEDED FOR MUSCLE SPASM 15 tablet 0  eszopiclone 3 MG TABS Take 1 tablet (3 mg total) by mouth at bedtime. Take immediately before bedtime 30 tablet 2   fluticasone (FLONASE) 50 MCG/ACT nasal spray Use one to two sprays in each nostril once daily as directed for nasal congestion. 16 g 5   LORazepam (ATIVAN) 0.5 MG tablet Take 1 tablet (0.5 mg total) by mouth 2 (two) times daily as needed for anxiety. 60 tablet 2   Magnesium Oxide 500 MG TABS Take 4 tablets by mouth at bedtime.     mirtazapine (REMERON) 7.5 MG tablet Take 1 tablet (7.5 mg total) by mouth at bedtime. 30 tablet 2   Multiple Vitamin (MULTIVITAMIN) tablet Take 1 tablet by mouth daily.     promethazine (PHENERGAN) 25 MG tablet TAKE 1 TABLET BY MOUTH EVERY 6 HOURS AS NEEDED FOR NAUSEA AND VOMITING 20 tablet 0   ramelteon (ROZEREM) 8 MG tablet Take 1 tablet (8 mg total) by mouth at bedtime. 90 tablet 3   VELIVET 0.1/0.125/0.15 -0.025 MG tablet  Take 1 tablet by mouth daily.     venlafaxine (EFFEXOR) 75 MG tablet Take 1 tablet (75 mg total) by mouth daily. 90 tablet 2   molnupiravir EUA (LAGEVRIO) 200 MG CAPS capsule Take 4 capsules (800 mg total) by mouth every 12 (twelve) hours. 40 capsule 0   No facility-administered medications prior to visit.    No Known Allergies  Review of Systems  Constitutional:  Positive for fatigue and fever. Negative for chills.  HENT:  Positive for ear pain (Bilateral), rhinorrhea and sore throat. Negative for congestion and sinus pressure.   Eyes:  Negative for pain.  Respiratory:  Negative for cough, chest tightness, shortness of breath and wheezing.   Cardiovascular:  Negative for chest pain and palpitations.  Gastrointestinal:  Negative for abdominal pain, constipation, diarrhea, nausea and vomiting.  Genitourinary:  Negative for dysuria and hematuria.  Musculoskeletal:  Positive for myalgias (Bodyaches). Negative for arthralgias, back pain and joint swelling.  Skin:  Negative for rash.  Neurological:  Positive for headaches. Negative for dizziness and weakness.  Psychiatric/Behavioral:  Negative for dysphoric mood. The patient is not nervous/anxious.       Objective:    Physical Exam Vitals reviewed.  Constitutional:      Appearance: Normal appearance. She is well-developed. She is ill-appearing.  HENT:     Right Ear: Tympanic membrane, ear canal and external ear normal.     Left Ear: Tympanic membrane, ear canal and external ear normal.     Nose: Congestion present.     Mouth/Throat:     Pharynx: Oropharyngeal exudate (very mild.) present. No posterior oropharyngeal erythema.  Cardiovascular:     Rate and Rhythm: Normal rate and regular rhythm.     Heart sounds: Normal heart sounds. No murmur heard. Pulmonary:     Effort: Pulmonary effort is normal. No respiratory distress.     Breath sounds: Normal breath sounds.  Lymphadenopathy:     Cervical: No cervical adenopathy.   Neurological:     Mental Status: She is alert and oriented to person, place, and time.  Psychiatric:        Mood and Affect: Mood normal.        Behavior: Behavior normal.    BP 110/78 (BP Location: Right Arm, Patient Position: Sitting)    Pulse 95    Temp 99 F (37.2 C) (Oral)    Ht '5\' 6"'  (1.676 m)    Wt 127 lb (57.6 kg)    SpO2  97%    BMI 20.50 kg/m  Wt Readings from Last 3 Encounters:  10/05/21 127 lb (57.6 kg)  09/21/21 128 lb (58.1 kg)  08/04/21 127 lb (57.6 kg)    Health Maintenance Due  Topic Date Due   TETANUS/TDAP  Never done    There are no preventive care reminders to display for this patient.   Lab Results  Component Value Date   TSH 0.907 02/04/2021   Lab Results  Component Value Date   WBC 6.1 02/04/2021   HGB 14.2 02/04/2021   HCT 42.9 02/04/2021   MCV 99 (H) 02/04/2021   PLT 270 02/04/2021   Lab Results  Component Value Date   NA 134 02/04/2021   K 4.7 02/04/2021   CO2 23 02/04/2021   GLUCOSE 83 02/04/2021   BUN 14 02/04/2021   CREATININE 0.86 02/04/2021   BILITOT 0.3 02/04/2021   ALKPHOS 44 02/04/2021   AST 21 02/04/2021   ALT 15 02/04/2021   PROT 6.5 02/04/2021   ALBUMIN 4.4 02/04/2021   CALCIUM 9.4 02/04/2021   EGFR 91 02/04/2021   Lab Results  Component Value Date   CHOL 156 02/04/2021   Lab Results  Component Value Date   HDL 84 02/04/2021   Lab Results  Component Value Date   LDLCALC 55 02/04/2021   Lab Results  Component Value Date   TRIG 93 02/04/2021   Lab Results  Component Value Date   CHOLHDL 1.9 02/04/2021   No results found for: HGBA1C     Assessment & Plan:   Problem List Items Addressed This Visit       Respiratory   Influenza    covid 19 and flu tests were negative.  She has been sick less than 24 hours and I believe her flu is a false negative.  Rx for tamiflu given.      Relevant Medications   oseltamivir (TAMIFLU) 75 MG capsule     Other   Sore throat - Primary    Negative strep test.        Relevant Orders   POCT Influenza A/B (Completed)   POC COVID-19 BinaxNow (Completed)   POCT rapid strep A (Completed)   Meds ordered this encounter  Medications   oseltamivir (TAMIFLU) 75 MG capsule    Sig: Take 1 capsule (75 mg total) by mouth 2 (two) times daily.    Dispense:  10 capsule    Refill:  0    Orders Placed This Encounter  Procedures   POCT Influenza A/B   POC COVID-19 BinaxNow   POCT rapid strep A     Follow-up: No follow-ups on file.  An After Visit Summary was printed and given to the patient.    I,Lauren M Auman,acting as a scribe for Rochel Brome, MD.,have documented all relevant documentation on the behalf of Rochel Brome, MD,as directed by  Rochel Brome, MD while in the presence of Rochel Brome, MD.   Rochel Brome, MD [place] [phone]

## 2021-10-07 ENCOUNTER — Other Ambulatory Visit: Payer: Self-pay | Admitting: Legal Medicine

## 2021-10-07 MED ORDER — AMOXICILLIN-POT CLAVULANATE 875-125 MG PO TABS: 1.0000 | ORAL_TABLET | Freq: Two times a day (BID) | ORAL | 0 refills | Status: DC

## 2021-10-07 NOTE — Progress Notes (Signed)
Cough, no fever tamiflu no help, Dr. Sedalia Muta told her to call back to on call person.  I called in augmentin, use mucinex also

## 2021-10-09 DIAGNOSIS — J111 Influenza due to unidentified influenza virus with other respiratory manifestations: Secondary | ICD-10-CM | POA: Insufficient documentation

## 2021-10-09 DIAGNOSIS — J029 Acute pharyngitis, unspecified: Secondary | ICD-10-CM | POA: Insufficient documentation

## 2021-10-09 NOTE — Assessment & Plan Note (Signed)
covid 19 and flu tests were negative.  She has been sick less than 24 hours and I believe her flu is a false negative.  Rx for tamiflu given.

## 2021-10-09 NOTE — Assessment & Plan Note (Signed)
Negative strep test.

## 2021-10-23 ENCOUNTER — Encounter: Payer: Self-pay | Admitting: Family Medicine

## 2021-11-22 ENCOUNTER — Other Ambulatory Visit: Payer: Self-pay

## 2021-11-22 ENCOUNTER — Ambulatory Visit: Payer: BC Managed Care – PPO | Admitting: Nurse Practitioner

## 2021-11-22 ENCOUNTER — Encounter: Payer: Self-pay | Admitting: Nurse Practitioner

## 2021-11-22 VITALS — BP 110/82 | HR 92 | Temp 97.3°F | Ht 66.0 in | Wt 118.0 lb

## 2021-11-22 DIAGNOSIS — Z8619 Personal history of other infectious and parasitic diseases: Secondary | ICD-10-CM

## 2021-11-22 DIAGNOSIS — J329 Chronic sinusitis, unspecified: Secondary | ICD-10-CM

## 2021-11-22 MED ORDER — FLUCONAZOLE 150 MG PO TABS
150.0000 mg | ORAL_TABLET | Freq: Once | ORAL | 0 refills | Status: DC
Start: 2021-11-22 — End: 2021-11-25

## 2021-11-22 MED ORDER — DOXYCYCLINE HYCLATE 100 MG PO TABS: 100.0000 mg | ORAL_TABLET | Freq: Two times a day (BID) | ORAL | 0 refills | Status: DC

## 2021-11-22 NOTE — Patient Instructions (Addendum)
Take Doxycycline as prescribed Take Diflucan after completing antibiotics We will place referral to immunology Follow-up as needed   Sinusitis, Adult Sinusitis is soreness and swelling (inflammation) of your sinuses. Sinuses are hollow spaces in the bones around your face. They are located: Around your eyes. In the middle of your forehead. Behind your nose. In your cheekbones. Your sinuses and nasal passages are lined with a fluid called mucus. Mucus drains out of your sinuses. Swelling can trap mucus in your sinuses. This lets germs (bacteria, virus, or fungus) grow, which leads to infection. Most of the time, this condition is caused by a virus. What are the causes? This condition is caused by: Allergies. Asthma. Germs. Things that block your nose or sinuses. Growths in the nose (nasal polyps). Chemicals or irritants in the air. Fungus (rare). What increases the risk? You are more likely to develop this condition if: You have a weak body defense system (immune system). You do a lot of swimming or diving. You use nasal sprays too much. You smoke. What are the signs or symptoms? The main symptoms of this condition are pain and a feeling of pressure around the sinuses. Other symptoms include: Stuffy nose (congestion). Runny nose (drainage). Swelling and warmth in the sinuses. Headache. Toothache. A cough that may get worse at night. Mucus that collects in the throat or the back of the nose (postnasal drip). Being unable to smell and taste. Being very tired (fatigue). A fever. Sore throat. Bad breath. How is this diagnosed? This condition is diagnosed based on: Your symptoms. Your medical history. A physical exam. Tests to find out if your condition is short-term (acute) or long-term (chronic). Your doctor may: Check your nose for growths (polyps). Check your sinuses using a tool that has a light (endoscope). Check for allergies or germs. Do imaging tests, such as an  MRI or CT scan. How is this treated? Treatment for this condition depends on the cause and whether it is short-term or long-term. If caused by a virus, your symptoms should go away on their own within 10 days. You may be given medicines to relieve symptoms. They include: Medicines that shrink swollen tissue in the nose. Medicines that treat allergies (antihistamines). A spray that treats swelling of the nostrils.  Rinses that help get rid of thick mucus in your nose (nasal saline washes). If caused by bacteria, your doctor may wait to see if you will get better without treatment. You may be given antibiotic medicine if you have: A very bad infection. A weak body defense system. If caused by growths in the nose, you may need to have surgery. Follow these instructions at home: Medicines Take, use, or apply over-the-counter and prescription medicines only as told by your doctor. These may include nasal sprays. If you were prescribed an antibiotic medicine, take it as told by your doctor. Do not stop taking the antibiotic even if you start to feel better. Hydrate and humidify  Drink enough water to keep your pee (urine) pale yellow. Use a cool mist humidifier to keep the humidity level in your home above 50%. Breathe in steam for 10-15 minutes, 3-4 times a day, or as told by your doctor. You can do this in the bathroom while a hot shower is running. Try not to spend time in cool or dry air. Rest Rest as much as you can. Sleep with your head raised (elevated). Make sure you get enough sleep each night. General instructions  Put a warm, moist washcloth  on your face 3-4 times a day, or as often as told by your doctor. This will help with discomfort. Wash your hands often with soap and water. If there is no soap and water, use hand sanitizer. Do not smoke. Avoid being around people who are smoking (secondhand smoke). Keep all follow-up visits as told by your doctor. This is important. Contact  a doctor if: You have a fever. Your symptoms get worse. Your symptoms do not get better within 10 days. Get help right away if: You have a very bad headache. You cannot stop throwing up (vomiting). You have very bad pain or swelling around your face or eyes. You have trouble seeing. You feel confused. Your neck is stiff. You have trouble breathing. Summary Sinusitis is swelling of your sinuses. Sinuses are hollow spaces in the bones around your face. This condition is caused by tissues in your nose that become inflamed or swollen. This traps germs. These can lead to infection. If you were prescribed an antibiotic medicine, take it as told by your doctor. Do not stop taking it even if you start to feel better. Keep all follow-up visits as told by your doctor. This is important. This information is not intended to replace advice given to you by your health care provider. Make sure you discuss any questions you have with your health care provider. Document Revised: 02/25/2018 Document Reviewed: 02/25/2018 Elsevier Patient Education  2022 [employer].

## 2021-11-22 NOTE — Progress Notes (Signed)
Acute Office Visit  Subjective:    Patient ID: Rebecca Wallace, female    DOB: [birth year], 36 y.o.   MRN: [id number]  Chief Complaint  Patient presents with   Sinusitis    HPI: Patient is in today for Upper respiratory symptoms She complains of achiness, congestion, nasal congestion, non productive cough, sinus pressure, sneezing, and sore throat. Onset of symptoms was yesterday and worsening.Treatment has included Advil, Tylenol Severe Sinus and Headache, and Mucinex. She is followed by Dr Deno Etienne, ENT at [facility] and [facility]. Olivia Mackie has undergone sinus surgery with minimal improvement of chronic sinusitis.   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[place])    GERD (gastroesophageal reflux disease)    History of cervical cancer    Idiopathic hypotension    Insomnia due to other mental disorder    Lyme disease    Major depressive disorder, recurrent, moderate (HCC)    Migraine without aura, not intractable, without status migrainosus    Other idiopathic scoliosis, lumbar region    Premenstrual tension syndrome    Small intestinal bacterial overgrowth    Xerosis cutis     Past Surgical History:  Procedure Laterality Date   ANAL FISSURE REPAIR  01/11/2019   BREAST ENHANCEMENT SURGERY  08/18/2009   CHOLECYSTECTOMY  2018   GANGLION CYST EXCISION     Left hand 2005   KNEE SURGERY Left 12/2017   SINOSCOPY  09/20/2011   TONSILLECTOMY  10/2007    Family History  Problem Relation Age of Onset   Diabetes Mother    Sjogren's syndrome Mother    High blood pressure Mother    Anxiety disorder Mother    Depression Mother    Diabetes Father    High blood pressure Maternal Grandmother    Heart Problems Maternal Grandmother    Heart attack Maternal Grandfather    COPD Other     Social History   Socioeconomic History   Marital status: Single    Spouse name: Not on file    Number of children: Not on file   Years of education: Not on file   Highest education level: Not on file  Occupational History   Occupation: book Therapist, nutritional  Tobacco Use   Smoking status: Former    Packs/day: 1.00    Years: 10.00    Pack years: 10.00    Types: Cigarettes    Quit date: 2016    Years since quitting: 7.1   Smokeless tobacco: Never  Vaping Use   Vaping Use: Every day   Devices: E-cig  Substance and Sexual Activity   Alcohol use: Yes    Comment: Seldom   Drug use: Never   Sexual activity: Not Currently  Other Topics Concern   Not on file  Social History Narrative   Not on file   Social Determinants of Health   Financial Resource Strain: Not on file  Food Insecurity: Not on file  Transportation Needs: Not on file  Physical Activity: Not on file  Stress: Not on file  Social Connections: Not on file  Intimate Partner Violence: Not on file    Outpatient Medications Prior to Visit  Medication Sig Dispense Refill   celecoxib (CELEBREX) 200 MG capsule Take 1 capsule by mouth twice daily 60 capsule 0   cetirizine (ZYRTEC) 10 MG tablet Take 10 mg by mouth daily.     cyclobenzaprine (FLEXERIL) 5  MG tablet TAKE 1 TABLET BY MOUTH THREE TIMES DAILY 270 tablet 0   diazepam (VALIUM) 5 MG tablet TAKE 1 TABLET BY MOUTH AT BEDTIME AS NEEDED FOR MUSCLE SPASM 15 tablet 0   eszopiclone 3 MG TABS Take 1 tablet (3 mg total) by mouth at bedtime. Take immediately before bedtime 30 tablet 2   fluticasone (FLONASE) 50 MCG/ACT nasal spray Use one to two sprays in each nostril once daily as directed for nasal congestion. 16 g 5   LORazepam (ATIVAN) 0.5 MG tablet Take 1 tablet (0.5 mg total) by mouth 2 (two) times daily as needed for anxiety. 60 tablet 2   Magnesium Oxide 500 MG TABS Take 4 tablets by mouth at bedtime.     mirtazapine (REMERON) 7.5 MG tablet Take 1 tablet (7.5 mg total) by mouth at bedtime. 30 tablet 2   Multiple Vitamin (MULTIVITAMIN) tablet Take 1 tablet by mouth  daily.     oseltamivir (TAMIFLU) 75 MG capsule Take 1 capsule (75 mg total) by mouth 2 (two) times daily. 10 capsule 0   promethazine (PHENERGAN) 25 MG tablet TAKE 1 TABLET BY MOUTH EVERY 6 HOURS AS NEEDED FOR NAUSEA AND VOMITING 20 tablet 0   ramelteon (ROZEREM) 8 MG tablet Take 1 tablet (8 mg total) by mouth at bedtime. 90 tablet 3   VELIVET 0.1/0.125/0.15 -0.025 MG tablet Take 1 tablet by mouth daily.     venlafaxine (EFFEXOR) 75 MG tablet Take 1 tablet (75 mg total) by mouth daily. 90 tablet 2   amoxicillin-clavulanate (AUGMENTIN) 875-125 MG tablet Take 1 tablet by mouth 2 (two) times daily. 20 tablet 0   No facility-administered medications prior to visit.    No Known Allergies  Review of Systems  Constitutional:  Positive for fatigue. Negative for chills and fever.  HENT:  Positive for congestion, ear pain, sinus pressure, sore throat and voice change (hoarseness). Negative for postnasal drip, rhinorrhea and sinus pain.   Eyes: Negative.   Respiratory:  Positive for cough. Negative for shortness of breath.   Cardiovascular:  Negative for chest pain.  Gastrointestinal:  Positive for diarrhea. Negative for nausea.  Endocrine: Negative.   Genitourinary: Negative.   Musculoskeletal:  Positive for arthralgias and myalgias.       Generalized body aches  Allergic/Immunologic: Positive for environmental allergies and immunocompromised state.  Neurological:  Positive for headaches. Negative for dizziness.  Hematological: Negative.       Objective:    Physical Exam Vitals reviewed.  Constitutional:      Appearance: She is ill-appearing.  HENT:     Nose: Congestion and rhinorrhea present.     Mouth/Throat:     Pharynx: Posterior oropharyngeal erythema present.  Cardiovascular:     Rate and Rhythm: Normal rate and regular rhythm.     Pulses: Normal pulses.     Heart sounds: Normal heart sounds.  Pulmonary:     Effort: Pulmonary effort is normal.     Breath sounds: Normal breath  sounds.  Skin:    General: Skin is warm and dry.     Capillary Refill: Capillary refill takes less than 2 seconds.  Neurological:     General: No focal deficit present.     Mental Status: She is alert and oriented to person, place, and time.    BP 110/82    Pulse 92    Temp (!) 97.3 F (36.3 C)    Ht '5\' 6"'  (1.676 m)    Wt 118 lb (53.5 kg)  SpO2 98%    BMI 19.05 kg/m   Wt Readings from Last 3 Encounters:  11/22/21 118 lb (53.5 kg)  10/05/21 127 lb (57.6 kg)  09/21/21 128 lb (58.1 kg)    Health Maintenance Due  Topic Date Due   TETANUS/TDAP  Never done       Lab Results  Component Value Date   TSH 0.907 02/04/2021   Lab Results  Component Value Date   WBC 6.1 02/04/2021   HGB 14.2 02/04/2021   HCT 42.9 02/04/2021   MCV 99 (H) 02/04/2021   PLT 270 02/04/2021   Lab Results  Component Value Date   NA 134 02/04/2021   K 4.7 02/04/2021   CO2 23 02/04/2021   GLUCOSE 83 02/04/2021   BUN 14 02/04/2021   CREATININE 0.86 02/04/2021   BILITOT 0.3 02/04/2021   ALKPHOS 44 02/04/2021   AST 21 02/04/2021   ALT 15 02/04/2021   PROT 6.5 02/04/2021   ALBUMIN 4.4 02/04/2021   CALCIUM 9.4 02/04/2021   EGFR 91 02/04/2021   Lab Results  Component Value Date   CHOL 156 02/04/2021   Lab Results  Component Value Date   HDL 84 02/04/2021   Lab Results  Component Value Date   LDLCALC 55 02/04/2021   Lab Results  Component Value Date   TRIG 93 02/04/2021   Lab Results  Component Value Date   CHOLHDL 1.9 02/04/2021        Assessment & Plan:   1. Chronic recurrent sinusitis - Ambulatory referral to Immunology - doxycycline (VIBRA-TABS) 100 MG tablet; Take 1 tablet (100 mg total) by mouth 2 (two) times daily.  Dispense: 14 tablet; Refill: 0  2. History of candidiasis of vagina - fluconazole (DIFLUCAN) 150 MG tablet; Take 1 tablet (150 mg total) by mouth once for 1 dose.  Dispense: 1 tablet; Refill: 0    Take Doxycycline as prescribed Take Diflucan after  completing antibiotics We will place referral to immunology Follow-up as needed    Follow-up: PRN  An After Visit Summary was printed and given to the patient.  I, Rip Harbour, NP, have reviewed all documentation for this visit. The documentation on 11/22/21 for the exam, diagnosis, procedures, and orders are all accurate and complete.    Signed, Rip Harbour, NP [facility] [phone]

## 2021-11-23 MED ORDER — ESZOPICLONE 3 MG PO TABS: 3.0000 mg | ORAL_TABLET | Freq: Every day | ORAL | 2 refills | Status: DC

## 2021-11-25 ENCOUNTER — Other Ambulatory Visit: Payer: Self-pay | Admitting: Nurse Practitioner

## 2021-11-25 DIAGNOSIS — Z8619 Personal history of other infectious and parasitic diseases: Secondary | ICD-10-CM

## 2021-11-28 ENCOUNTER — Telehealth: Payer: Self-pay

## 2021-11-28 ENCOUNTER — Other Ambulatory Visit: Payer: Self-pay | Admitting: Nurse Practitioner

## 2021-11-28 DIAGNOSIS — Z8619 Personal history of other infectious and parasitic diseases: Secondary | ICD-10-CM

## 2021-11-28 MED ORDER — FLUCONAZOLE 150 MG PO TABS
150.0000 mg | ORAL_TABLET | Freq: Once | ORAL | 0 refills | Status: AC
Start: 2021-11-28 — End: 2021-11-28

## 2021-11-28 NOTE — Telephone Encounter (Signed)
Patient informed. [place] 

## 2021-11-28 NOTE — Telephone Encounter (Signed)
Patient called stating that her yeast infection is not better and I asked patient if she did pick up the diflucan that was sent in for her on Friday and she said yes she stated she took it on Friday. She states that its helped a little bit, but she said that she is still having a lot of discomfort. Do you want me to schedule patient to come in?

## 2021-11-29 ENCOUNTER — Other Ambulatory Visit: Payer: Self-pay

## 2021-11-29 ENCOUNTER — Ambulatory Visit: Payer: BC Managed Care – PPO | Admitting: Nurse Practitioner

## 2021-11-29 ENCOUNTER — Encounter: Payer: Self-pay | Admitting: Nurse Practitioner

## 2021-11-29 VITALS — BP 104/80 | HR 94 | Temp 97.3°F | Ht 66.0 in | Wt 130.0 lb

## 2021-11-29 DIAGNOSIS — T3695XA Adverse effect of unspecified systemic antibiotic, initial encounter: Secondary | ICD-10-CM

## 2021-11-29 DIAGNOSIS — B379 Candidiasis, unspecified: Secondary | ICD-10-CM

## 2021-11-29 DIAGNOSIS — Z8742 Personal history of other diseases of the female genital tract: Secondary | ICD-10-CM

## 2021-11-29 DIAGNOSIS — B3731 Acute candidiasis of vulva and vagina: Secondary | ICD-10-CM | POA: Diagnosis not present

## 2021-11-29 LAB — POCT URINALYSIS DIP (CLINITEK)
Bilirubin, UA: NEGATIVE
Blood, UA: NEGATIVE
Glucose, UA: NEGATIVE mg/dL
Ketones, POC UA: NEGATIVE mg/dL
Leukocytes, UA: NEGATIVE
Nitrite, UA: NEGATIVE
POC PROTEIN,UA: NEGATIVE
Spec Grav, UA: 1.01 (ref 1.010–1.025)
Urobilinogen, UA: 0.2 E.U./dL
pH, UA: 6 (ref 5.0–8.0)

## 2021-11-29 MED ORDER — TERCONAZOLE 0.4 % VA CREA: 1.0000 | TOPICAL_CREAM | Freq: Every day | VAGINAL | 1 refills | Status: DC

## 2021-11-29 NOTE — Progress Notes (Signed)
Acute Office Visit  Subjective:    Patient ID: Rebecca Wallace, female    DOB: 1986-03-06, 36 y.o.   MRN: [id number]  CC: Dysuria  HPI: Patient is in today for Urinary symptoms  She reports new onset dysuria. The current episode started a few days ago and is staying constant. Treatment has included Diflucan 150 mg x 2 doses. She was recently treated with a course of Doxycycline for chronic sinusitis. Patient states symptoms are 4/10 in intensity, occurring intermittently. She  has not been recently treated for similar symptoms.    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)    GERD (gastroesophageal reflux disease)    History of cervical cancer    Idiopathic hypotension    Insomnia due to other mental disorder    Lyme disease    Major depressive disorder, recurrent, moderate (HCC)    Migraine without aura, not intractable, without status migrainosus    Other idiopathic scoliosis, lumbar region    Premenstrual tension syndrome    Small intestinal bacterial overgrowth    Xerosis cutis     Past Surgical History:  Procedure Laterality Date   ANAL FISSURE REPAIR  01/11/2019   BREAST ENHANCEMENT SURGERY  08/18/2009   CHOLECYSTECTOMY  2018   GANGLION CYST EXCISION     Left hand 2005   KNEE SURGERY Left 12/2017   SINOSCOPY  09/20/2011   TONSILLECTOMY  10/2007    Family History  Problem Relation Age of Onset   Diabetes Mother    Sjogren's syndrome Mother    High blood pressure Mother    Anxiety disorder Mother    Depression Mother    Diabetes Father    High blood pressure Maternal Grandmother    Heart Problems Maternal Grandmother    Heart attack Maternal Grandfather    COPD Other     Social History   Socioeconomic History    Marital status: Single    Spouse name: Not on file   Number of children: Not on file   Years of education: Not on file   Highest education level: Not on file  Occupational History   Occupation: book Therapist, nutritional  Tobacco Use   Smoking status: Former    Packs/day: 1.00    Years: 10.00    Pack years: 10.00    Types: Cigarettes    Quit date: 2016    Years since quitting: 7.1   Smokeless tobacco: Never  Vaping Use   Vaping Use: Every day   Devices: E-cig  Substance and Sexual Activity   Alcohol use: Yes    Comment: Seldom   Drug use: Never   Sexual activity: Not Currently  Other Topics Concern   Not on file  Social History Narrative   Not on file   Social Determinants of Health   Financial Resource Strain: Not on file  Food Insecurity: Not on file  Transportation Needs: Not on file  Physical Activity: Not on file  Stress: Not on file  Social Connections: Not on file  Intimate Partner Violence: Not on file    Outpatient Medications Prior to Visit  Medication Sig Dispense Refill   celecoxib (CELEBREX) 200 MG capsule Take 1 capsule by mouth twice daily  60 capsule 0   cetirizine (ZYRTEC) 10 MG tablet Take 10 mg by mouth daily.     cyclobenzaprine (FLEXERIL) 5 MG tablet TAKE 1 TABLET BY MOUTH THREE TIMES DAILY 270 tablet 0   diazepam (VALIUM) 5 MG tablet TAKE 1 TABLET BY MOUTH AT BEDTIME AS NEEDED FOR MUSCLE SPASM 15 tablet 0   doxycycline (VIBRA-TABS) 100 MG tablet Take 1 tablet (100 mg total) by mouth 2 (two) times daily. 14 tablet 0   eszopiclone 3 MG TABS Take 1 tablet (3 mg total) by mouth at bedtime. Take immediately before bedtime 30 tablet 2   fluconazole (DIFLUCAN) 150 MG tablet TAKE 1 TABLET BY MOUTH ONCE FOR 1 DOSE 1 tablet 0   fluticasone (FLONASE) 50 MCG/ACT nasal spray Use one to two sprays in each nostril once daily as directed for nasal congestion. 16 g 5   LORazepam (ATIVAN) 0.5 MG tablet Take 1 tablet (0.5 mg total) by mouth 2 (two) times daily as needed for  anxiety. 60 tablet 2   Magnesium Oxide 500 MG TABS Take 4 tablets by mouth at bedtime.     mirtazapine (REMERON) 7.5 MG tablet Take 1 tablet (7.5 mg total) by mouth at bedtime. 30 tablet 2   Multiple Vitamin (MULTIVITAMIN) tablet Take 1 tablet by mouth daily.     oseltamivir (TAMIFLU) 75 MG capsule Take 1 capsule (75 mg total) by mouth 2 (two) times daily. 10 capsule 0   promethazine (PHENERGAN) 25 MG tablet TAKE 1 TABLET BY MOUTH EVERY 6 HOURS AS NEEDED FOR NAUSEA AND VOMITING 20 tablet 0   ramelteon (ROZEREM) 8 MG tablet Take 1 tablet (8 mg total) by mouth at bedtime. 90 tablet 3   VELIVET 0.1/0.125/0.15 -0.025 MG tablet Take 1 tablet by mouth daily.     venlafaxine (EFFEXOR) 75 MG tablet Take 1 tablet (75 mg total) by mouth daily. 90 tablet 2   No facility-administered medications prior to visit.    No Known Allergies  Review of Systems  Constitutional:  Negative for fatigue and fever.  Genitourinary:  Positive for dyspareunia, dysuria and vaginal pain (itching).  Musculoskeletal:  Negative for back pain.  All other systems reviewed and are negative.     Objective:    Physical Exam Vitals reviewed.  Constitutional:      Appearance: Normal appearance.  Skin:    General: Skin is warm and dry.     Capillary Refill: Capillary refill takes less than 2 seconds.  Neurological:     General: No focal deficit present.     Mental Status: She is alert and oriented to person, place, and time.  Psychiatric:        Mood and Affect: Mood normal.        Behavior: Behavior normal.   BP 104/80    Pulse 94    Temp (!) 97.3 F (36.3 C)    Ht _0  (1.676 m)    Wt 130 lb (59 kg)    SpO2 99%    BMI 20.98 kg/m   Wt Readings from Last 3 Encounters:  11/22/21 118 lb (53.5 kg)  10/05/21 127 lb (57.6 kg)  09/21/21 128 lb (58.1 kg)    Health Maintenance Due  Topic Date Due   TETANUS/TDAP  Never done       Lab Results  Component Value Date   TSH 0.907 02/04/2021   Lab Results   Component Value Date   WBC 6.1 02/04/2021   HGB 14.2 02/04/2021   HCT  42.9 02/04/2021   MCV 99 (H) 02/04/2021   PLT 270 02/04/2021   Lab Results  Component Value Date   NA 134 02/04/2021   K 4.7 02/04/2021   CO2 23 02/04/2021   GLUCOSE 83 02/04/2021   BUN 14 02/04/2021   CREATININE 0.86 02/04/2021   BILITOT 0.3 02/04/2021   ALKPHOS 44 02/04/2021   AST 21 02/04/2021   ALT 15 02/04/2021   PROT 6.5 02/04/2021   ALBUMIN 4.4 02/04/2021   CALCIUM 9.4 02/04/2021   EGFR 91 02/04/2021   Lab Results  Component Value Date   CHOL 156 02/04/2021   Lab Results  Component Value Date   HDL 84 02/04/2021   Lab Results  Component Value Date   LDLCALC 55 02/04/2021   Lab Results  Component Value Date   TRIG 93 02/04/2021   Lab Results  Component Value Date   CHOLHDL 1.9 02/04/2021        Assessment & Plan:   1. Candidal vulvovaginitis - POCT URINALYSIS DIP (CLINITEK) - terconazole (TERAZOL 7) 0.4 % vaginal cream; Place 1 applicator vaginally at bedtime for 3 days.  Dispense: 45 g; Refill: 1  2. Antibiotic-induced yeast infection - terconazole (TERAZOL 7) 0.4 % vaginal cream; Place 1 applicator vaginally at bedtime for 3 days.  Dispense: 45 g; Refill: 1  3. History of vaginitis    Use Terconazole cream nightly for 3 nights Take Probiotics and eat yogurt daily Use over the counter Hylafem pH suppositories Follow-up as needed    Follow-up: PRN  An After Visit Summary was printed and given to the patient.  I, Rip Harbour, NP, have reviewed all documentation for this visit. The documentation on 11/29/21 for the exam, diagnosis, procedures, and orders are all accurate and complete.     Signed, Rip Harbour, NP [place] [phone]

## 2021-11-29 NOTE — Patient Instructions (Addendum)
Use Terconazole cream nightly for 3 nights Take Probiotics and eat yogurt daily Use over the counter Hylafem pH suppositories Follow-up as needed    Vaginal Yeast Infection, Adult Vaginal yeast infection is a condition that causes vaginal discharge as well as soreness, swelling, and redness (inflammation) of the vagina. This is a common condition. Some women get this infection frequently. What are the causes? This condition is caused by a change in the normal balance of the yeast (Candida) and normal bacteria that live in the vagina. This change causes an overgrowth of yeast, which causes the inflammation. What increases the risk? The condition is more likely to develop in women who: Take antibiotic medicines. Have diabetes. Take birth control pills. Are pregnant. Douche often. Have a weak body defense system (immune system). Have been taking steroid medicines for a long time. Frequently wear tight clothing. What are the signs or symptoms? Symptoms of this condition include: White, thick, creamy vaginal discharge. Swelling, itching, redness, and irritation of the vagina. The lips of the vagina (labia) may be affected as well. Pain or a burning feeling while urinating. Pain during sex. How is this diagnosed? This condition is diagnosed based on: Your medical history. A physical exam. A pelvic exam. Your health care provider will examine a sample of your vaginal discharge under a microscope. Your health care provider may send this sample for testing to confirm the diagnosis. How is this treated? This condition is treated with medicine. Medicines may be over-the-counter or prescription. You may be told to use one or more of the following: Medicine that is taken by mouth (orally). Medicine that is applied as a cream (topically). Medicine that is inserted directly into the vagina (suppository). Follow these instructions at home: Take or apply over-the-counter and prescription  medicines only as told by your health care provider. Do not use tampons until your health care provider approves. Do not have sex until your infection has cleared. Sex can prolong or worsen your symptoms of infection. Ask your health care provider when it is safe to resume sexual activity. Keep all follow-up visits. This is important. How is this prevented?  Do not wear tight clothes, such as pantyhose or tight pants. Wear breathable cotton underwear. Do not use douches, perfumed soap, creams, or powders. Wipe from front to back after using the toilet. If you have diabetes, keep your blood sugar levels under control. Ask your health care provider for other ways to prevent yeast infections. Contact a health care provider if: You have a fever. Your symptoms go away and then return. Your symptoms do not get better with treatment. Your symptoms get worse. You have new symptoms. You develop blisters in or around your vagina. You have blood coming from your vagina and it is not your menstrual period. You develop pain in your abdomen. Summary Vaginal yeast infection is a condition that causes discharge as well as soreness, swelling, and redness (inflammation) of the vagina. This condition is treated with medicine. Medicines may be over-the-counter or prescription. Take or apply over-the-counter and prescription medicines only as told by your health care provider. Do not douche. Resume sexual activity or use of tampons as instructed by your health care provider. Contact a health care provider if your symptoms do not get better with treatment or your symptoms go away and then return. This information is not intended to replace advice given to you by your health care provider. Make sure you discuss any questions you have with your health care provider.  Document Revised: 12/13/2020 Document Reviewed: 12/13/2020 Elsevier Patient Education  2022 [employer] Vaginitis Vaginitis is irritation and  swelling of the vagina. Treatment will depend on the cause. What are the causes? It can be caused by: Bacteria. Yeast. A parasite. A virus. Low hormone levels. Bubble baths, scented tampons, and feminine sprays. Other things can change the balance of the yeast and bacteria that live in the vagina. These include: Antibiotic medicines. Not being clean enough. Some birth control methods. Sex. Infection. Diabetes. A weakened body defense system (immune system). What increases the risk? Smoking or being around someone who smokes. Using washes (douches), scented tampons, or scented pads. Wearing tight pants or thong underwear. Using birth control pills or an IUD. Having sex without a condom or having a lot of partners. Having an STI. Using a certain product to kill sperm (nonoxynol-9). Eating foods that are high in sugar. Having diabetes. Having low levels of a female hormone. Having a weakened body defense system. Being pregnant or breastfeeding. What are the signs or symptoms? Fluid coming from the vagina that is not normal. A bad smell. Itching, pain, or swelling. Pain with sex. Pain or burning when you pee (urinate). Sometimes there are no symptoms. How is this treated? Treatment may include: Antibiotic creams or pills. Antifungal medicines. Medicines to ease symptoms if you have a virus. Your sex partner should also be treated. Estrogen medicines. Avoiding scented soaps, sprays, or douches. Stopping use of products that caused irritation and then using a cream to treat symptoms. Follow these instructions at home: Lifestyle Keep the area around your vagina clean and dry. Avoid using soap. Rinse the area with water. Until your doctor says it is okay: Do not use washes for the vagina. Do not use tampons. Do not have sex. Wipe from front to back after going to the bathroom. When your doctor says it is okay, practice safe sex and use condoms. General  instructions Take over-the-counter and prescription medicines only as told by your doctor. If you were prescribed an antibiotic medicine, take or use it as told by your doctor. Do not stop taking or using it even if you start to feel better. Keep all follow-up visits. How is this prevented? Do not use things that can irritate the vagina, such as fabric softeners. Avoid these products if they are scented: Sprays. Detergents. Tampons. Products for cleaning the vagina. Soaps or bubble baths. Let air reach your vagina. To do this: Wear cotton underwear. Do not wear: Underwear while you sleep. Tight pants. Thong underwear. Underwear or nylons without a cotton panel. Take off any wet clothing, such as bathing suits, as soon as you can. Practice safe sex and use condoms. Contact a doctor if: You have pain in your belly or in the area between your hips. You have a fever or chills. Your symptoms last for more than 2-3 days. Get help right away if: You have a fever and your symptoms get worse all of a sudden. Summary Vaginitis is irritation and swelling of the vagina. Treatment will depend on the cause of the condition. Do not use washes or tampons or have sex until your doctor says it is okay. This information is not intended to replace advice given to you by your health care provider. Make sure you discuss any questions you have with your health care provider. Document Revised: 03/25/2020 Document Reviewed: 03/25/2020 Elsevier Patient Education  2022 [employer].

## 2021-12-09 ENCOUNTER — Other Ambulatory Visit: Payer: Self-pay | Admitting: Family Medicine

## 2021-12-09 DIAGNOSIS — F419 Anxiety disorder, unspecified: Secondary | ICD-10-CM

## 2022-01-07 ENCOUNTER — Other Ambulatory Visit: Payer: Self-pay | Admitting: Legal Medicine

## 2022-01-16 ENCOUNTER — Other Ambulatory Visit: Payer: Self-pay | Admitting: Family Medicine

## 2022-01-16 DIAGNOSIS — F419 Anxiety disorder, unspecified: Secondary | ICD-10-CM

## 2022-02-02 ENCOUNTER — Encounter: Payer: Self-pay | Admitting: Family Medicine

## 2022-02-02 ENCOUNTER — Ambulatory Visit: Payer: BC Managed Care – PPO | Admitting: Family Medicine

## 2022-02-02 VITALS — BP 102/72 | HR 80 | Temp 97.8°F | Ht 66.0 in | Wt 126.0 lb

## 2022-02-02 DIAGNOSIS — K6389 Other specified diseases of intestine: Secondary | ICD-10-CM | POA: Diagnosis not present

## 2022-02-02 DIAGNOSIS — M249 Joint derangement, unspecified: Secondary | ICD-10-CM

## 2022-02-02 DIAGNOSIS — R1030 Lower abdominal pain, unspecified: Secondary | ICD-10-CM | POA: Diagnosis not present

## 2022-02-02 DIAGNOSIS — M62830 Muscle spasm of back: Secondary | ICD-10-CM | POA: Diagnosis not present

## 2022-02-02 DIAGNOSIS — J3089 Other allergic rhinitis: Secondary | ICD-10-CM | POA: Diagnosis not present

## 2022-02-02 DIAGNOSIS — F5101 Primary insomnia: Secondary | ICD-10-CM

## 2022-02-02 DIAGNOSIS — R110 Nausea: Secondary | ICD-10-CM | POA: Diagnosis not present

## 2022-02-02 DIAGNOSIS — K625 Hemorrhage of anus and rectum: Secondary | ICD-10-CM | POA: Diagnosis not present

## 2022-02-02 DIAGNOSIS — F330 Major depressive disorder, recurrent, mild: Secondary | ICD-10-CM

## 2022-02-02 DIAGNOSIS — J302 Other seasonal allergic rhinitis: Secondary | ICD-10-CM

## 2022-02-02 MED ORDER — TRAZODONE HCL 50 MG PO TABS: 50.0000 mg | ORAL_TABLET | Freq: Every day | ORAL | 1 refills | Status: DC

## 2022-02-02 NOTE — Progress Notes (Signed)
? ?Subjective:  ?Patient ID: Rebecca Wallace, female    DOB: 07-Apr-1986  Age: 36 y.o. MRN: [id number] ? ?Chief Complaint  ?Patient presents with  ? insomnia  ? ?HPI: ?Insomnia: Patient is currently taking Lunesta 3 mg take 1 tablet at bed time. Patient states that she is still waking up several times during the night regardless.  ? ?Musculoskeletal symptoms: flexeril and valium. On celebrex.  ? ?Depression: Patient is currently taking Effexor 75 mg take 1 tablet once daily, Lorazepam 0.5 mg take 1 tablet by mouth BID prn anxiety. ? ?  02/02/2022  ?  3:45 PM 07/05/2021  ?  2:18 PM 01/26/2021  ? 11:07 AM  ?PHQ9 SCORE ONLY  ?PHQ-9 Total Score 2 3 0  ?  ?Current Outpatient Medications on File Prior to Visit  ?Medication Sig Dispense Refill  ? cetirizine (ZYRTEC) 10 MG tablet Take 10 mg by mouth daily.    ? cyclobenzaprine (FLEXERIL) 5 MG tablet TAKE 1 TABLET BY MOUTH THREE TIMES DAILY 270 tablet 0  ? diazepam (VALIUM) 5 MG tablet TAKE 1 TABLET BY MOUTH AT BEDTIME AS NEEDED FOR MUSCLE SPASM 15 tablet 0  ? eszopiclone 3 MG TABS Take 1 tablet (3 mg total) by mouth at bedtime. Take immediately before bedtime 30 tablet 2  ? fluticasone (FLONASE) 50 MCG/ACT nasal spray Use one to two sprays in each nostril once daily as directed for nasal congestion. 16 g 5  ? ipratropium (ATROVENT) 0.06 % nasal spray Place 2 sprays into both nostrils 3 (three) times daily.    ? LORazepam (ATIVAN) 0.5 MG tablet Take 1 tablet by mouth twice daily as needed for anxiety 60 tablet 0  ? Magnesium Oxide 500 MG TABS Take 4 tablets by mouth at bedtime.    ? Multiple Vitamin (MULTIVITAMIN) tablet Take 1 tablet by mouth daily.    ? promethazine (PHENERGAN) 25 MG tablet TAKE 1 TABLET BY MOUTH EVERY 6 HOURS AS NEEDED FOR NAUSEA AND VOMITING 20 tablet 0  ? VELIVET 0.1/0.125/0.15 -0.025 MG tablet Take 1 tablet by mouth daily.    ? venlafaxine (EFFEXOR) 75 MG tablet Take 1 tablet by mouth once daily 90 tablet 0  ? ?No current facility-administered medications  on file prior to visit.  ? ?Past Medical History:  ?Diagnosis Date  ? Allergy to other foods   ? 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 GERD (gastroesophageal reflux disease)   ? History of cervical cancer   ? Idiopathic hypotension   ? Insomnia due to other mental disorder   ? Lyme disease   ? Major depressive disorder, recurrent, moderate ([place])   ? Migraine without aura, not intractable, without status migrainosus   ? Other idiopathic scoliosis, lumbar region   ? Premenstrual tension syndrome   ? Small intestinal bacterial overgrowth   ? Xerosis cutis   ? ?Past Surgical History:  ?Procedure Laterality Date  ? ANAL FISSURE REPAIR  01/11/2019  ? BREAST ENHANCEMENT SURGERY  08/18/2009  ? CHOLECYSTECTOMY  2018  ? GANGLION CYST EXCISION    ? Left hand 2005  ? KNEE SURGERY Left 12/2017  ? SINOSCOPY  09/20/2011  ? TONSILLECTOMY  10/2007  ?  ?Family History  ?Problem Relation Age of Onset  ? Diabetes Mother   ? Sjogren's syndrome Mother   ? High blood pressure Mother   ? Anxiety disorder Mother   ? Depression Mother   ? Diabetes Father   ? High blood pressure Maternal Grandmother   ? Heart  Problems Maternal Grandmother   ? Heart attack Maternal Grandfather   ? COPD Other   ? ?Social History  ? ?Socioeconomic History  ? Marital status: Single  ?  Spouse name: Not on file  ? Number of children: Not on file  ? Years of education: Not on file  ? Highest education level: Not on file  ?Occupational History  ? Occupation: Clinical cytogeneticist  ?Tobacco Use  ? Smoking status: Former  ?  Packs/day: 1.00  ?  Years: 10.00  ?  Pack years: 10.00  ?  Types: Cigarettes  ?  Quit date: 2016  ?  Years since quitting: 7.3  ? Smokeless tobacco: Never  ?Vaping Use  ? Vaping Use: Every day  ? Devices: E-cig  ?Substance and Sexual Activity  ? Alcohol use: Yes  ?  Comment: Seldom  ? Drug use: Never  ? Sexual activity: Not Currently  ?Other Topics Concern  ? Not on file  ?Social History Narrative  ? Not on file  ? ?Social Determinants of Health   ? ?Financial Resource Strain: Not on file  ?Food Insecurity: Not on file  ?Transportation Needs: Not on file  ?Physical Activity: Not on file  ?Stress: Not on file  ?Social Connections: Not on file  ? ? ?Review of Systems  ?Constitutional:  Negative for appetite change, fatigue and fever.  ?HENT:  Negative for congestion, ear pain, sinus pressure and sore throat.   ?Respiratory:  Negative for cough, chest tightness, shortness of breath and wheezing.   ?Cardiovascular:  Negative for chest pain and palpitations.  ?Gastrointestinal:  Negative for abdominal pain, constipation, diarrhea, nausea and vomiting.  ?Genitourinary:  Negative for dysuria and hematuria.  ?Musculoskeletal:  Negative for arthralgias, back pain, joint swelling and myalgias.  ?Skin:  Negative for rash.  ?Neurological:  Negative for dizziness, weakness and headaches.  ?Psychiatric/Behavioral:  Positive for sleep disturbance. Negative for dysphoric mood. The patient is not nervous/anxious.   ? ? ?Objective:  ?BP 102/72 (BP Location: Left Arm, Patient Position: Sitting)   Pulse 80   Temp 97.8 ?F (36.6 ?C) (Temporal)   Ht 5\' 6"  (1.676 m)   Wt 126 lb (57.2 kg)   SpO2 94%   BMI 20.34 kg/m?  ? ? ?  02/02/2022  ?  3:36 PM 11/29/2021  ?  9:49 AM 11/22/2021  ?  1:40 PM  ?BP/Weight  ?Systolic BP [id number]  ?Diastolic BP 72 80 82  ?Wt. (Lbs) 126 130 118  ?BMI 20.34 kg/m2 20.98 kg/m2 19.05 kg/m2  ? ? ?Physical Exam ?Vitals reviewed.  ?Constitutional:   ?   Appearance: Normal appearance. She is normal weight.  ?HENT:  ?   Nose: Congestion (turbinates swelling. pale.) present.  ?Cardiovascular:  ?   Rate and Rhythm: Normal rate and regular rhythm.  ?   Pulses: Normal pulses.  ?   Heart sounds: Normal heart sounds.  ?Pulmonary:  ?   Effort: Pulmonary effort is normal.  ?   Breath sounds: Normal breath sounds.  ?Abdominal:  ?   General: Abdomen is flat. Bowel sounds are normal.  ?   Palpations: Abdomen is soft.  ?Neurological:  ?   Mental Status: She is alert  and oriented to person, place, and time.  ?Psychiatric:     ?   Mood and Affect: Mood normal.     ?   Behavior: Behavior normal.  ? ? ?Diabetic Foot Exam - Simple   ?No data filed ?  ?  ? ?  Lab Results  ?Component Value Date  ? WBC 6.1 02/04/2021  ? HGB 14.2 02/04/2021  ? HCT 42.9 02/04/2021  ? PLT 270 02/04/2021  ? GLUCOSE 83 02/04/2021  ? CHOL 156 02/04/2021  ? TRIG 93 02/04/2021  ? HDL 84 02/04/2021  ? [place] 55 02/04/2021  ? ALT 15 02/04/2021  ? AST 21 02/04/2021  ? NA 134 02/04/2021  ? K 4.7 02/04/2021  ? CL 100 02/04/2021  ? CREATININE 0.86 02/04/2021  ? BUN 14 02/04/2021  ? CO2 23 02/04/2021  ? TSH 0.907 02/04/2021  ? ? ? ? ?Assessment & Plan:  ? ?Problem List Items Addressed This Visit   ? ?  ? Respiratory  ? Seasonal and perennial allergic rhinitis - Primary  ?  The current medical regimen is effective;  continue present plan and medications. ? ? ?  ?  ?  ? Other  ? Depression, major, recurrent, mild (HCC) (Chronic)  ? Relevant Medications  ? traZODone (DESYREL) 50 MG tablet  ? Spasm of back muscles  ?  The current medical regimen is effective;  continue present plan and medications. ? ? ?  ?  ? Primary insomnia  ?  Poorly controlled.  ?Continue lunesta 3 mg before bed.  ?Add trazodone 50 mg one before bed.  ? ?  ?  ? Hypermobile joints  ?  Continue flexeril.  ?Continue diazepam.  ? ?  ?  ?. ? ?Meds ordered this encounter  ?Medications  ? traZODone (DESYREL) 50 MG tablet  ?  Sig: Take 1 tablet (50 mg total) by mouth at bedtime.  ?  Dispense:  90 tablet  ?  Refill:  1  ? ? ?Follow-up: Return in about 6 months (around 08/04/2022) for cpe. ? ?An After Visit Summary was printed and given to the patient. ? ?I,Lauren M Auman,acting as a scribe for Rochel Brome, MD.,have documented all relevant documentation on the behalf of Rochel Brome, MD,as directed by  Rochel Brome, MD while in the presence of Rochel Brome, MD.  ? ? ? ?Rochel Brome, MD ?[place] ?[phone]

## 2022-02-05 NOTE — Assessment & Plan Note (Signed)
Continue flexeril.  ?Continue diazepam.  ?

## 2022-02-05 NOTE — Assessment & Plan Note (Signed)
Poorly controlled.  ?Continue lunesta 3 mg before bed.  ?Add trazodone 50 mg one before bed.  ?

## 2022-02-05 NOTE — Assessment & Plan Note (Signed)
The current medical regimen is effective;  continue present plan and medications.  

## 2022-02-15 ENCOUNTER — Other Ambulatory Visit: Payer: Self-pay | Admitting: Family Medicine

## 2022-02-27 DIAGNOSIS — K648 Other hemorrhoids: Secondary | ICD-10-CM | POA: Diagnosis not present

## 2022-02-27 DIAGNOSIS — K625 Hemorrhage of anus and rectum: Secondary | ICD-10-CM | POA: Diagnosis not present

## 2022-02-27 DIAGNOSIS — K5909 Other constipation: Secondary | ICD-10-CM | POA: Diagnosis not present

## 2022-02-27 DIAGNOSIS — K644 Residual hemorrhoidal skin tags: Secondary | ICD-10-CM | POA: Diagnosis not present

## 2022-03-07 ENCOUNTER — Other Ambulatory Visit: Payer: Self-pay | Admitting: Family Medicine

## 2022-03-07 DIAGNOSIS — F419 Anxiety disorder, unspecified: Secondary | ICD-10-CM

## 2022-03-19 ENCOUNTER — Other Ambulatory Visit: Payer: Self-pay | Admitting: Family Medicine

## 2022-03-22 ENCOUNTER — Ambulatory Visit (INDEPENDENT_AMBULATORY_CARE_PROVIDER_SITE_OTHER): Payer: BC Managed Care – PPO | Admitting: Nurse Practitioner

## 2022-03-22 ENCOUNTER — Encounter: Payer: Self-pay | Admitting: Nurse Practitioner

## 2022-03-22 VITALS — BP 98/62 | HR 92 | Temp 98.9°F | Resp 18 | Ht 66.0 in | Wt 124.0 lb

## 2022-03-22 DIAGNOSIS — J0181 Other acute recurrent sinusitis: Secondary | ICD-10-CM | POA: Diagnosis not present

## 2022-03-22 DIAGNOSIS — Z8619 Personal history of other infectious and parasitic diseases: Secondary | ICD-10-CM

## 2022-03-22 MED ORDER — FLUCONAZOLE 150 MG PO TABS: 150.0000 mg | ORAL_TABLET | Freq: Once | ORAL | 0 refills | Status: DC

## 2022-03-22 MED ORDER — AMOXICILLIN-POT CLAVULANATE 875-125 MG PO TABS: 1.0000 | ORAL_TABLET | Freq: Two times a day (BID) | ORAL | 0 refills | Status: DC

## 2022-03-22 NOTE — Progress Notes (Addendum)
Subjective:  Patient ID: Rebecca Wallace, female    DOB: Jan 24, 1986  Age: 36 y.o. MRN: [id number]  Chief Complaint  Patient presents with   Nasal Congestion    HPI Upper respiratory symptoms She complains of congestion, headache, nasal congestion, nausea without vomiting, sinus pressure, and sore throat. Denies fever, chills, or rash. Onset of symptoms was a few days ago and gradually worsening.Treatment has included Mucinex-DM and Tylenol cold and sinus . Past history is significant for  chronic allergic rhinitis . Patient is former smoker, quit several years ago Mucinex-DM, tylenol severe sinus and allergy.   Current Outpatient Medications on File Prior to Visit  Medication Sig Dispense Refill   cetirizine (ZYRTEC) 10 MG tablet Take 10 mg by mouth daily.     cyclobenzaprine (FLEXERIL) 5 MG tablet TAKE 1 TABLET BY MOUTH THREE TIMES DAILY 270 tablet 0   diazepam (VALIUM) 5 MG tablet TAKE 1 TABLET BY MOUTH AT BEDTIME AS NEEDED FOR MUSCLE SPASM 15 tablet 0   Eszopiclone 3 MG TABS TAKE 1 TABLET BY MOUTH AT BEDTIME TAKE  IMMEDIATELY  BEFORE  BEDTIME 30 tablet 3   fluticasone (FLONASE) 50 MCG/ACT nasal spray Use one to two sprays in each nostril once daily as directed for nasal congestion. 16 g 5   ipratropium (ATROVENT) 0.06 % nasal spray Place 2 sprays into both nostrils 3 (three) times daily.     LORazepam (ATIVAN) 0.5 MG tablet Take 1 tablet by mouth twice daily as needed for anxiety 60 tablet 0   Magnesium Oxide 500 MG TABS Take 4 tablets by mouth at bedtime.     Multiple Vitamin (MULTIVITAMIN) tablet Take 1 tablet by mouth daily.     promethazine (PHENERGAN) 25 MG tablet TAKE 1 TABLET BY MOUTH EVERY 6 HOURS AS NEEDED FOR NAUSEA AND VOMITING 20 tablet 0   traZODone (DESYREL) 50 MG tablet Take 1 tablet (50 mg total) by mouth at bedtime. 90 tablet 1   VELIVET 0.1/0.125/0.15 -0.025 MG tablet Take 1 tablet by mouth daily.     venlafaxine (EFFEXOR) 75 MG tablet Take 1 tablet by mouth once  daily 90 tablet 0   No current facility-administered medications on file prior to visit.   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[place])    GERD (gastroesophageal reflux disease)    History of cervical cancer    Idiopathic hypotension    Insomnia due to other mental disorder    Lyme disease    Major depressive disorder, recurrent, moderate (HCC)    Migraine without aura, not intractable, without status migrainosus    Other idiopathic scoliosis, lumbar region    Premenstrual tension syndrome    Small intestinal bacterial overgrowth    Xerosis cutis    Past Surgical History:  Procedure Laterality Date   ANAL FISSURE REPAIR  01/11/2019   BREAST ENHANCEMENT SURGERY  08/18/2009   CHOLECYSTECTOMY  2018   GANGLION CYST EXCISION     Left hand 2005   KNEE SURGERY Left 12/2017   SINOSCOPY  09/20/2011   TONSILLECTOMY  10/2007    Family History  Problem Relation Age of Onset   Diabetes Mother    Sjogren's syndrome Mother    High blood pressure Mother    Anxiety disorder Mother    Depression Mother    Diabetes Father    High blood pressure Maternal Grandmother    Heart Problems Maternal Grandmother  Heart attack Maternal Grandfather    COPD Other    Social History   Socioeconomic History   Marital status: Single    Spouse name: Not on file   Number of children: Not on file   Years of education: Not on file   Highest education level: Not on file  Occupational History   Occupation: book Biomedical engineer  Tobacco Use   Smoking status: Former    Packs/day: 1.00    Years: 10.00    Total pack years: 10.00    Types: Cigarettes    Quit date: 2016    Years since quitting: 7.4   Smokeless tobacco: Never  Vaping Use   Vaping Use: Every day   Devices: E-cig  Substance and Sexual Activity   Alcohol use: Yes    Comment: Seldom   Drug use: Never   Sexual activity: Not Currently  Other Topics Concern   Not on file  Social  History Narrative   Not on file   Social Determinants of Health   Financial Resource Strain: Not on file  Food Insecurity: Not on file  Transportation Needs: Not on file  Physical Activity: Not on file  Stress: Not on file  Social Connections: Not on file    Review of Systems  Constitutional:  Positive for fatigue. Negative for fever.  HENT:  Positive for congestion, ear pain, sinus pressure and sore throat.   Respiratory:  Positive for cough.   Gastrointestinal:  Positive for nausea. Negative for vomiting.  Allergic/Immunologic: Positive for environmental allergies.  Neurological:  Positive for headaches.     Objective:  BP 98/62   Pulse 92   Temp 98.9 F (37.2 C)   Resp 18   Ht 5\' 6"  (1.676 m)   Wt 124 lb (56.2 kg)   SpO2 97%   BMI 20.01 kg/m      03/22/2022    3:49 PM 02/02/2022    3:36 PM 11/29/2021    9:49 AM  BP/Weight  Systolic BP 98 102 104  Diastolic BP 62 72 80  Wt. (Lbs) 124 126 130  BMI 20.01 kg/m2 20.34 kg/m2 20.98 kg/m2    Physical Exam Vitals reviewed.  Constitutional:      Appearance: She is ill-appearing.  HENT:     Right Ear: Tenderness present. Tympanic membrane is injected and scarred.     Left Ear: Tympanic membrane normal.     Nose: Congestion and rhinorrhea present.     Mouth/Throat:     Pharynx: Posterior oropharyngeal erythema present.  Cardiovascular:     Rate and Rhythm: Normal rate and regular rhythm.     Pulses: Normal pulses.     Heart sounds: Normal heart sounds.  Pulmonary:     Effort: Pulmonary effort is normal.     Breath sounds: Normal breath sounds.  Skin:    General: Skin is warm and dry.     Capillary Refill: Capillary refill takes less than 2 seconds.  Neurological:     General: No focal deficit present.     Mental Status: She is alert and oriented to person, place, and time.         Lab Results  Component Value Date   WBC 6.1 02/04/2021   HGB 14.2 02/04/2021   HCT 42.9 02/04/2021   PLT 270 02/04/2021    GLUCOSE 83 02/04/2021   CHOL 156 02/04/2021   TRIG 93 02/04/2021   HDL 84 02/04/2021   LDLCALC 55 02/04/2021   ALT 15 02/04/2021  AST 21 02/04/2021   NA 134 02/04/2021   K 4.7 02/04/2021   CL 100 02/04/2021   CREATININE 0.86 02/04/2021   BUN 14 02/04/2021   CO2 23 02/04/2021   TSH 0.907 02/04/2021      Assessment & Plan:   1. Other acute recurrent sinusitis - amoxicillin-clavulanate (AUGMENTIN) 875-125 MG tablet; Take 1 tablet by mouth 2 (two) times daily.  Dispense: 20 tablet; Refill: 0  2. History of candidiasis of vagina - fluconazole (DIFLUCAN) 150 MG tablet; Take 1 tablet (150 mg total) by mouth once for 1 dose.  Dispense: 1 tablet; Refill: 0     Take Augmentin twice daily for 10 days Take Diflucan after completing Augmentin Continue Flonase and Zyrtec Follow-up as needed  Follow-up: PRN  An After Visit Summary was printed and given to the patient.  I,Victoria C Greene,acting as a Education administrator for [employer], NP.,have documented all relevant documentation on the behalf of Rip Harbour, NP,as directed by  Rip Harbour, NP while in the presence of Rip Harbour, NP.   I, Rip Harbour, NP, have reviewed all documentation for this visit. The documentation on 03/22/22 for the exam, diagnosis, procedures, and orders are all accurate and complete.    Signed, Rip Harbour, NP [place] [phone]

## 2022-03-22 NOTE — Patient Instructions (Addendum)
Take Augmentin twice daily for 10 days Take Diflucan after completing Augmentin Continue Flonase and Zyrtec Follow-up as needed   Sinus Infection, Adult A sinus infection is soreness and swelling (inflammation) of your sinuses. Sinuses are hollow spaces in the bones around your face. They are located: Around your eyes. In the middle of your forehead. Behind your nose. In your cheekbones. Your sinuses and nasal passages are lined with a fluid called mucus. Mucus drains out of your sinuses. Swelling can trap mucus in your sinuses. This lets germs (bacteria, virus, or fungus) grow, which leads to infection. Most of the time, this condition is caused by a virus. What are the causes? Allergies. Asthma. Germs. Things that block your nose or sinuses. Growths in the nose (nasal polyps). Chemicals or irritants in the air. A fungus. This is rare. What increases the risk? Having a weak body defense system (immune system). Doing a lot of swimming or diving. Using nasal sprays too much. Smoking. What are the signs or symptoms? The main symptoms of this condition are pain and a feeling of pressure around the sinuses. Other symptoms include: Stuffy nose (congestion). This may make it hard to breathe through your nose. Runny nose (drainage). Soreness, swelling, and warmth in the sinuses. A cough that may get worse at night. Being unable to smell and taste. Mucus that collects in the throat or the back of the nose (postnasal drip). This may cause a sore throat or bad breath. Being very tired (fatigued). A fever. How is this diagnosed? Your symptoms. Your medical history. A physical exam. Tests to find out if your condition is short-term (acute) or long-term (chronic). Your doctor may: Check your nose for growths (polyps). Check your sinuses using a tool that has a light on one end (endoscope). Check for allergies or germs. Do imaging tests, such as an MRI or CT scan. How is this  treated? Treatment for this condition depends on the cause and whether it is short-term or long-term. If caused by a virus, your symptoms should go away on their own within 10 days. You may be given medicines to relieve symptoms. They include: Medicines that shrink swollen tissue in the nose. A spray that treats swelling of the nostrils. Rinses that help get rid of thick mucus in your nose (nasal saline washes). Medicines that treat allergies (antihistamines). Over-the-counter pain relievers. If caused by bacteria, your doctor may wait to see if you will get better without treatment. You may be given antibiotic medicine if you have: A very bad infection. A weak body defense system. If caused by growths in the nose, surgery may be needed. Follow these instructions at home: Medicines Take, use, or apply over-the-counter and prescription medicines only as told by your doctor. These may include nasal sprays. If you were prescribed an antibiotic medicine, take it as told by your doctor. Do not stop taking it even if you start to feel better. Hydrate and humidify  Drink enough water to keep your pee (urine) pale yellow. Use a cool mist humidifier to keep the humidity level in your home above 50%. Breathe in steam for 10-15 minutes, 3-4 times a day, or as told by your doctor. You can do this in the bathroom while a hot shower is running. Try not to spend time in cool or dry air. Rest Rest as much as you can. Sleep with your head raised (elevated). Make sure you get enough sleep each night. General instructions  Put a warm, moist washcloth on  your face 3-4 times a day, or as often as told by your doctor. Use nasal saline washes as often as told by your doctor. Wash your hands often with soap and water. If you cannot use soap and water, use hand sanitizer. Do not smoke. Avoid being around people who are smoking (secondhand smoke). Keep all follow-up visits. Contact a doctor if: You have a  fever. Your symptoms get worse. Your symptoms do not get better within 10 days. Get help right away if: You have a very bad headache. You cannot stop vomiting. You have very bad pain or swelling around your face or eyes. You have trouble seeing. You feel confused. Your neck is stiff. You have trouble breathing. These symptoms may be an emergency. Get help right away. Call 911. Do not wait to see if the symptoms will go away. Do not drive yourself to the hospital. Summary A sinus infection is swelling of your sinuses. Sinuses are hollow spaces in the bones around your face. This condition is caused by tissues in your nose that become inflamed or swollen. This traps germs. These can lead to infection. If you were prescribed an antibiotic medicine, take it as told by your doctor. Do not stop taking it even if you start to feel better. Keep all follow-up visits. This information is not intended to replace advice given to you by your health care provider. Make sure you discuss any questions you have with your health care provider. Document Revised: 08/30/2021 Document Reviewed: 08/30/2021 Elsevier Patient Education  [place].

## 2022-03-30 ENCOUNTER — Other Ambulatory Visit: Payer: Self-pay

## 2022-03-30 ENCOUNTER — Telehealth: Payer: Self-pay

## 2022-03-30 ENCOUNTER — Other Ambulatory Visit: Payer: Self-pay | Admitting: Nurse Practitioner

## 2022-03-30 DIAGNOSIS — Z8619 Personal history of other infectious and parasitic diseases: Secondary | ICD-10-CM

## 2022-03-30 MED ORDER — FLUCONAZOLE 150 MG PO TABS: 150.0000 mg | ORAL_TABLET | Freq: Once | ORAL | 0 refills | Status: AC

## 2022-03-30 NOTE — Telephone Encounter (Signed)
RX sent per Dr. Sedalia Muta.

## 2022-03-30 NOTE — Progress Notes (Signed)
Jamal Collin C, CMA 8 hours ago (8:07 AM)    Patient complaining of vaginal yeast infection. She was given augmentin last week. She is requesting diflucan be sent to [facility] in [place].    Terrill Mohr 03/30/22 8:06 AM        Note

## 2022-03-30 NOTE — Telephone Encounter (Signed)
Patient complaining of vaginal yeast infection. She was given augmentin last week. She is requesting diflucan be sent to [facility] in [place].   Lorita Officer, CCMA 03/30/22 8:06 AM

## 2022-04-03 ENCOUNTER — Other Ambulatory Visit: Payer: Self-pay

## 2022-04-03 DIAGNOSIS — Z8619 Personal history of other infectious and parasitic diseases: Secondary | ICD-10-CM

## 2022-04-03 MED ORDER — FLUCONAZOLE 150 MG PO TABS: ORAL_TABLET | ORAL | 1 refills | Status: DC

## 2022-04-17 ENCOUNTER — Other Ambulatory Visit: Payer: Self-pay | Admitting: Family Medicine

## 2022-04-17 DIAGNOSIS — F419 Anxiety disorder, unspecified: Secondary | ICD-10-CM

## 2022-05-08 ENCOUNTER — Other Ambulatory Visit: Payer: Self-pay | Admitting: Nurse Practitioner

## 2022-05-12 ENCOUNTER — Other Ambulatory Visit: Payer: Self-pay | Admitting: Family Medicine

## 2022-05-17 ENCOUNTER — Other Ambulatory Visit: Payer: Self-pay

## 2022-05-17 MED ORDER — IPRATROPIUM BROMIDE 0.06 % NA SOLN
2.0000 | Freq: Three times a day (TID) | NASAL | 3 refills | Status: DC
Start: 2022-05-17 — End: 2022-09-05

## 2022-05-17 NOTE — Telephone Encounter (Signed)
Patient is calling requesting if we can refill this medication for her. The nasal spray was originally written by the ENT, and she had called them for more refills and they stated that she would need to call her primary care to refill it from now on. So could you send this medication to [facility] for patient?

## 2022-05-19 ENCOUNTER — Other Ambulatory Visit: Payer: Self-pay | Admitting: Family Medicine

## 2022-05-19 DIAGNOSIS — F419 Anxiety disorder, unspecified: Secondary | ICD-10-CM

## 2022-06-01 DIAGNOSIS — E538 Deficiency of other specified B group vitamins: Secondary | ICD-10-CM | POA: Diagnosis not present

## 2022-06-01 DIAGNOSIS — Z77120 Contact with and (suspected) exposure to mold (toxic): Secondary | ICD-10-CM | POA: Diagnosis not present

## 2022-06-01 DIAGNOSIS — D509 Iron deficiency anemia, unspecified: Secondary | ICD-10-CM | POA: Diagnosis not present

## 2022-06-01 DIAGNOSIS — E559 Vitamin D deficiency, unspecified: Secondary | ICD-10-CM | POA: Diagnosis not present

## 2022-06-01 DIAGNOSIS — A6920 Lyme disease, unspecified: Secondary | ICD-10-CM | POA: Diagnosis not present

## 2022-06-01 DIAGNOSIS — R5383 Other fatigue: Secondary | ICD-10-CM | POA: Diagnosis not present

## 2022-06-01 DIAGNOSIS — E039 Hypothyroidism, unspecified: Secondary | ICD-10-CM | POA: Diagnosis not present

## 2022-06-06 DIAGNOSIS — M5431 Sciatica, right side: Secondary | ICD-10-CM | POA: Diagnosis not present

## 2022-06-06 DIAGNOSIS — M419 Scoliosis, unspecified: Secondary | ICD-10-CM | POA: Diagnosis not present

## 2022-06-29 ENCOUNTER — Other Ambulatory Visit: Payer: Self-pay | Admitting: Family Medicine

## 2022-07-03 DIAGNOSIS — Z01419 Encounter for gynecological examination (general) (routine) without abnormal findings: Secondary | ICD-10-CM | POA: Diagnosis not present

## 2022-07-03 DIAGNOSIS — Z1151 Encounter for screening for human papillomavirus (HPV): Secondary | ICD-10-CM | POA: Diagnosis not present

## 2022-07-03 DIAGNOSIS — Z01411 Encounter for gynecological examination (general) (routine) with abnormal findings: Secondary | ICD-10-CM | POA: Diagnosis not present

## 2022-07-03 DIAGNOSIS — N6311 Unspecified lump in the right breast, upper outer quadrant: Secondary | ICD-10-CM | POA: Diagnosis not present

## 2022-07-04 DIAGNOSIS — M50122 Cervical disc disorder at C5-C6 level with radiculopathy: Secondary | ICD-10-CM | POA: Diagnosis not present

## 2022-07-04 DIAGNOSIS — M4722 Other spondylosis with radiculopathy, cervical region: Secondary | ICD-10-CM | POA: Diagnosis not present

## 2022-07-12 DIAGNOSIS — M50122 Cervical disc disorder at C5-C6 level with radiculopathy: Secondary | ICD-10-CM | POA: Diagnosis not present

## 2022-07-12 DIAGNOSIS — M47812 Spondylosis without myelopathy or radiculopathy, cervical region: Secondary | ICD-10-CM | POA: Diagnosis not present

## 2022-07-12 DIAGNOSIS — M4802 Spinal stenosis, cervical region: Secondary | ICD-10-CM | POA: Diagnosis not present

## 2022-07-12 DIAGNOSIS — M50223 Other cervical disc displacement at C6-C7 level: Secondary | ICD-10-CM | POA: Diagnosis not present

## 2022-07-12 DIAGNOSIS — M2578 Osteophyte, vertebrae: Secondary | ICD-10-CM | POA: Diagnosis not present

## 2022-07-13 DIAGNOSIS — N6311 Unspecified lump in the right breast, upper outer quadrant: Secondary | ICD-10-CM | POA: Diagnosis not present

## 2022-07-13 DIAGNOSIS — M50122 Cervical disc disorder at C5-C6 level with radiculopathy: Secondary | ICD-10-CM | POA: Diagnosis not present

## 2022-07-13 DIAGNOSIS — M4802 Spinal stenosis, cervical region: Secondary | ICD-10-CM | POA: Diagnosis not present

## 2022-07-13 DIAGNOSIS — R922 Inconclusive mammogram: Secondary | ICD-10-CM | POA: Diagnosis not present

## 2022-07-14 DIAGNOSIS — M4802 Spinal stenosis, cervical region: Secondary | ICD-10-CM | POA: Diagnosis not present

## 2022-07-14 DIAGNOSIS — Z01812 Encounter for preprocedural laboratory examination: Secondary | ICD-10-CM | POA: Diagnosis not present

## 2022-07-14 DIAGNOSIS — M4722 Other spondylosis with radiculopathy, cervical region: Secondary | ICD-10-CM | POA: Diagnosis not present

## 2022-07-14 DIAGNOSIS — M50122 Cervical disc disorder at C5-C6 level with radiculopathy: Secondary | ICD-10-CM | POA: Diagnosis not present

## 2022-07-17 DIAGNOSIS — M4802 Spinal stenosis, cervical region: Secondary | ICD-10-CM | POA: Diagnosis not present

## 2022-07-17 DIAGNOSIS — M4322 Fusion of spine, cervical region: Secondary | ICD-10-CM | POA: Diagnosis not present

## 2022-07-17 DIAGNOSIS — M2578 Osteophyte, vertebrae: Secondary | ICD-10-CM | POA: Diagnosis not present

## 2022-07-17 DIAGNOSIS — M4722 Other spondylosis with radiculopathy, cervical region: Secondary | ICD-10-CM | POA: Diagnosis not present

## 2022-07-17 DIAGNOSIS — M50122 Cervical disc disorder at C5-C6 level with radiculopathy: Secondary | ICD-10-CM | POA: Diagnosis not present

## 2022-07-19 ENCOUNTER — Other Ambulatory Visit: Payer: Self-pay | Admitting: Family Medicine

## 2022-08-04 ENCOUNTER — Other Ambulatory Visit: Payer: Self-pay | Admitting: Family Medicine

## 2022-08-07 DIAGNOSIS — R928 Other abnormal and inconclusive findings on diagnostic imaging of breast: Secondary | ICD-10-CM | POA: Diagnosis not present

## 2022-08-07 DIAGNOSIS — N6311 Unspecified lump in the right breast, upper outer quadrant: Secondary | ICD-10-CM | POA: Diagnosis not present

## 2022-08-07 DIAGNOSIS — N6312 Unspecified lump in the right breast, upper inner quadrant: Secondary | ICD-10-CM | POA: Diagnosis not present

## 2022-08-08 ENCOUNTER — Ambulatory Visit: Payer: BC Managed Care – PPO | Admitting: Family Medicine

## 2022-08-08 VITALS — BP 100/62 | HR 74 | Temp 97.6°F | Resp 16 | Ht 66.0 in | Wt 124.4 lb

## 2022-08-08 DIAGNOSIS — Z23 Encounter for immunization: Secondary | ICD-10-CM | POA: Diagnosis not present

## 2022-08-08 DIAGNOSIS — F5101 Primary insomnia: Secondary | ICD-10-CM

## 2022-08-08 DIAGNOSIS — M4802 Spinal stenosis, cervical region: Secondary | ICD-10-CM

## 2022-08-08 DIAGNOSIS — F411 Generalized anxiety disorder: Secondary | ICD-10-CM | POA: Diagnosis not present

## 2022-08-08 MED ORDER — LORAZEPAM 0.5 MG PO TABS: 0.5000 mg | ORAL_TABLET | Freq: Two times a day (BID) | ORAL | 2 refills | Status: DC | PRN

## 2022-08-08 MED ORDER — TRAZODONE HCL 50 MG PO TABS: 50.0000 mg | ORAL_TABLET | Freq: Every evening | ORAL | 0 refills | Status: DC | PRN

## 2022-08-08 MED ORDER — PROMETHAZINE HCL 25 MG PO TABS: ORAL_TABLET | ORAL | 1 refills | Status: DC

## 2022-08-08 NOTE — Progress Notes (Unsigned)
Subjective:  Patient ID: Rebecca Wallace, female    DOB: 23-Jan-1986  Age: 36 y.o. MRN: [id number]  Chief Complaint  Patient presents with   Depression   Anxiety    Depression        Associated symptoms include no fatigue, no myalgias and no headaches.  Past medical history includes anxiety.   Anxiety Patient reports no chest pain, dizziness, nausea or shortness of breath.    Admit date: 07/17/2022 Discharge date: 07/17/2022 Discharge to: Home Discharge Service: Orthopedic Spine Discharge Attending Physician: Malachy Chamber, MD Discharge Diagnosis: s/p C5-C6 ACDF Secondary Diagnoses: Principal Problem: Spinal stenosis in cervical region OR Procedures:  S/p C5-C6 ACDF  Discharged on norco, but only took 3 pills. Discharged on flexeril 5 mg three times a day as needed.  Using heat and ice.   Breast lump in right breast. Biopsy done yesterday and results were normal.   Sleep has improved on lunesta 3 mg before bed. Has not been taking trazodone.   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)    GERD (gastroesophageal reflux disease)    History of cervical cancer    Idiopathic hypotension    Insomnia due to other mental disorder    Lyme disease    Major depressive disorder, recurrent, moderate (HCC)    Migraine without aura, not intractable, without status migrainosus    Other idiopathic scoliosis, lumbar region    Premenstrual tension syndrome    Small intestinal  bacterial overgrowth    Xerosis cutis    Past Surgical History:  Procedure Laterality Date   ANAL FISSURE REPAIR  01/11/2019   BREAST ENHANCEMENT SURGERY  08/18/2009   CHOLECYSTECTOMY  2018   GANGLION CYST EXCISION     Left hand 2005   KNEE SURGERY Left 12/2017   SINOSCOPY  09/20/2011   TONSILLECTOMY  10/2007    Family History  Problem Relation Age of Onset   Diabetes Mother    Sjogren's syndrome Mother    High blood pressure Mother    Anxiety disorder Mother    Depression Mother    Diabetes Father    High blood pressure Maternal Grandmother    Heart Problems Maternal Grandmother    Heart attack Maternal Grandfather    COPD Other    Social History   Socioeconomic History   Marital status: Single    Spouse name: Not on file   Number of children: Not on file   Years of education: Not on file   Highest education level: Not on file  Occupational History   Occupation: book Biomedical engineer  Tobacco Use   Smoking status: Former    Packs/day: 1.00    Years: 10.00    Total pack years: 10.00    Types: Cigarettes    Quit date: 2016  Years since quitting: 7.8   Smokeless tobacco: Never  Vaping Use   Vaping Use: Every day   Devices: E-cig  Substance and Sexual Activity   Alcohol use: Yes    Comment: Seldom   Drug use: Never   Sexual activity: Not Currently  Other Topics Concern   Not on file  Social History Narrative   Not on file   Social Determinants of Health   Financial Resource Strain: Not on file  Food Insecurity: Not on file  Transportation Needs: Not on file  Physical Activity: Not on file  Stress: Not on file  Social Connections: Not on file    Review of Systems  Constitutional:  Negative for chills and fatigue.  HENT:  Negative for congestion, ear pain, nosebleeds, sinus pain and sore throat.   Respiratory:  Negative for cough and shortness of breath.   Cardiovascular:  Negative for chest pain and leg swelling.  Gastrointestinal:  Negative for abdominal  pain, constipation, diarrhea, nausea and vomiting.  Genitourinary:  Negative for dysuria and frequency.  Musculoskeletal:  Negative for arthralgias, back pain and myalgias.  Neurological:  Negative for dizziness and headaches.  Psychiatric/Behavioral:  Positive for depression.      Objective:  BP 100/62   Pulse 74   Temp 97.6 F (36.4 C)   Resp 16   Ht 5\' 6"  (1.676 m)   Wt 124 lb 6.4 oz (56.4 kg)   SpO2 100%   BMI 20.08 kg/m      08/08/2022    3:21 PM 03/22/2022    3:49 PM 02/02/2022    3:36 PM  BP/Weight  Systolic BP 100 98 102  Diastolic BP 62 62 72  Wt. (Lbs) 124.4 124 126  BMI 20.08 kg/m2 20.01 kg/m2 20.34 kg/m2    Physical Exam Vitals reviewed.  Constitutional:      Appearance: Normal appearance. She is normal weight.  Neck:     Vascular: No carotid bruit.  Cardiovascular:     Rate and Rhythm: Normal rate and regular rhythm.     Heart sounds: Normal heart sounds.  Pulmonary:     Effort: Pulmonary effort is normal. No respiratory distress.     Breath sounds: Normal breath sounds.  Abdominal:     General: Abdomen is flat. Bowel sounds are normal.     Palpations: Abdomen is soft.     Tenderness: There is no abdominal tenderness.  Musculoskeletal:     Comments: Neck brace.  Neurological:     Mental Status: She is alert and oriented to person, place, and time.  Psychiatric:        Mood and Affect: Mood normal.        Behavior: Behavior normal.     Diabetic Foot Exam - Simple   No data filed      Lab Results  Component Value Date   WBC 6.1 02/04/2021   HGB 14.2 02/04/2021   HCT 42.9 02/04/2021   PLT 270 02/04/2021   GLUCOSE 83 02/04/2021   CHOL 156 02/04/2021   TRIG 93 02/04/2021   HDL 84 02/04/2021   LDLCALC 55 02/04/2021   ALT 15 02/04/2021   AST 21 02/04/2021   NA 134 02/04/2021   K 4.7 02/04/2021   CL 100 02/04/2021   CREATININE 0.86 02/04/2021   BUN 14 02/04/2021   CO2 23 02/04/2021   TSH 0.907 02/04/2021      Assessment & Plan:    Problem List Items Addressed This Visit  Other   Primary insomnia    Continue lunesta 3 mg before bed.       GAD (generalized anxiety disorder)    The current medical regimen is effective;  continue present plan and medications.  Lorazepam 0.5 mg twice a day PRN. Trazodone 50 mg at bedtime for sleep.      Relevant Medications   traZODone (DESYREL) 50 MG tablet   LORazepam (ATIVAN) 0.5 MG tablet   Cervical stenosis of spine - Primary    Management per specialist. S/P surgery      Need for diphtheria-tetanus-pertussis (Tdap) vaccine   Relevant Orders   Tdap vaccine greater than or equal to 7yo IM (Completed)  .  Meds ordered this encounter  Medications   promethazine (PHENERGAN) 25 MG tablet    Sig: TAKE 1 TABLET BY MOUTH EVERY 6 HOURS AS NEEDED FOR NAUSEA AND VOMITING    Dispense:  30 tablet    Refill:  1   traZODone (DESYREL) 50 MG tablet    Sig: Take 1 tablet (50 mg total) by mouth at bedtime as needed for sleep.    Dispense:  1 tablet    Refill:  0   LORazepam (ATIVAN) 0.5 MG tablet    Sig: Take 1 tablet (0.5 mg total) by mouth 2 (two) times daily as needed. for anxiety    Dispense:  60 tablet    Refill:  2    Orders Placed This Encounter  Procedures   Tdap vaccine greater than or equal to 7yo IM     Follow-up: Return in about 6 months (around 02/06/2023) for chronic follow up. as directed by  Rochel Brome, MD while in the presence of Rochel Brome, MD.   An After Visit Summary was printed and given to the patient.  Rochel Brome, MD [facility] [phone]

## 2022-08-11 NOTE — Assessment & Plan Note (Addendum)
The current medical regimen is effective;  continue present plan and medications.  Lorazepam 0.5 mg twice a day PRN. Trazodone 50 mg at bedtime for sleep.

## 2022-08-11 NOTE — Assessment & Plan Note (Signed)
Management per specialist. S/P surgery

## 2022-08-12 ENCOUNTER — Encounter: Payer: Self-pay | Admitting: Family Medicine

## 2022-08-12 DIAGNOSIS — Z23 Encounter for immunization: Secondary | ICD-10-CM | POA: Insufficient documentation

## 2022-08-12 NOTE — Assessment & Plan Note (Signed)
Continue lunesta 3 mg before bed.  

## 2022-08-14 DIAGNOSIS — N926 Irregular menstruation, unspecified: Secondary | ICD-10-CM | POA: Diagnosis not present

## 2022-08-15 DIAGNOSIS — M4322 Fusion of spine, cervical region: Secondary | ICD-10-CM | POA: Diagnosis not present

## 2022-08-15 DIAGNOSIS — M50122 Cervical disc disorder at C5-C6 level with radiculopathy: Secondary | ICD-10-CM | POA: Diagnosis not present

## 2022-09-05 ENCOUNTER — Encounter: Payer: Self-pay | Admitting: Physician Assistant

## 2022-09-05 ENCOUNTER — Ambulatory Visit: Payer: BC Managed Care – PPO | Admitting: Physician Assistant

## 2022-09-05 VITALS — BP 108/74 | HR 90 | Temp 97.1°F | Ht 66.0 in | Wt 121.6 lb

## 2022-09-05 DIAGNOSIS — Z3A01 Less than 8 weeks gestation of pregnancy: Secondary | ICD-10-CM | POA: Diagnosis not present

## 2022-09-05 DIAGNOSIS — J069 Acute upper respiratory infection, unspecified: Secondary | ICD-10-CM | POA: Insufficient documentation

## 2022-09-05 LAB — POCT INFLUENZA A/B
Influenza A, POC: NEGATIVE
Influenza B, POC: NEGATIVE

## 2022-09-05 LAB — POC COVID19 BINAXNOW: SARS Coronavirus 2 Ag: NEGATIVE

## 2022-09-05 MED ORDER — AMOXICILLIN 875 MG PO TABS: 875.0000 mg | ORAL_TABLET | Freq: Two times a day (BID) | ORAL | 0 refills | Status: AC

## 2022-09-05 NOTE — Progress Notes (Signed)
Acute Office Visit  Subjective:    Patient ID: Rebecca Wallace, female    DOB: 25-Mar-1986, 36 y.o.   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[place])    GERD (gastroesophageal reflux disease)    History of cervical cancer    Idiopathic hypotension    Insomnia due to other mental disorder    Lyme disease    Major depressive disorder, recurrent, moderate (HCC)    Migraine without aura, not intractable, without status migrainosus    Other idiopathic scoliosis, lumbar region    Premenstrual tension syndrome    Small intestinal bacterial overgrowth    Xerosis cutis     Past Surgical History:  Procedure Laterality Date   ANAL FISSURE REPAIR  01/11/2019   BREAST ENHANCEMENT SURGERY  08/18/2009   CHOLECYSTECTOMY  2018   GANGLION CYST EXCISION     Left hand 2005   KNEE SURGERY Left 12/2017   SINOSCOPY  09/20/2011   TONSILLECTOMY  10/2007    Family History  Problem Relation Age of Onset   Diabetes Mother    Sjogren's syndrome Mother    High blood pressure Mother    Anxiety disorder Mother    Depression Mother    Diabetes Father    High blood pressure Maternal Grandmother    Heart Problems Maternal Grandmother    Heart attack Maternal Grandfather    COPD Other     Social History   Socioeconomic History   Marital status: Single    Spouse name: Not on file   Number of children: Not on file   Years of  education: Not on file   Highest education level: Not on file  Occupational History   Occupation: book Therapist, nutritional  Tobacco Use   Smoking status: Former    Packs/day: 1.00    Years: 10.00    Total pack years: 10.00    Types: Cigarettes    Quit date: 2016    Years since quitting: 7.9   Smokeless tobacco: Never  Vaping Use   Vaping Use: Every day   Devices: E-cig  Substance and Sexual Activity   Alcohol use: Yes    Comment: Seldom   Drug use: Never   Sexual activity: Not Currently  Other Topics Concern   Not on file  Social History Narrative   Not on file   Social Determinants of Health   Financial Resource Strain: Not on file  Food Insecurity: Not on file  Transportation Needs: Not on file  Physical Activity: Not on file  Stress: Not on file  Social Connections: Not on file  Intimate Partner Violence: Not on file    Outpatient Medications Prior to Visit  Medication Sig Dispense Refill   cetirizine (ZYRTEC) 10 MG tablet Take 10 mg by mouth daily.     cyclobenzaprine (FLEXERIL) 5 MG tablet TAKE 1 TABLET  BY MOUTH THREE TIMES DAILY 270 tablet 0   diazepam (VALIUM) 5 MG tablet TAKE 1 TABLET BY MOUTH AT BEDTIME AS NEEDED FOR MUSCLE SPASM 15 tablet 0   Eszopiclone 3 MG TABS TAKE 1 TABLET BY MOUTH AT BEDTIME TAKE  IMMEDIATELY  BEFORE  BEDTIME 30 tablet 3   ipratropium (ATROVENT) 0.06 % nasal spray Place 2 sprays into both nostrils 3 (three) times daily. 15 mL 3   LORazepam (ATIVAN) 0.5 MG tablet Take 1 tablet (0.5 mg total) by mouth 2 (two) times daily as needed. for anxiety 60 tablet 2   Magnesium Oxide 500 MG TABS Take 4 tablets by mouth at bedtime.     Multiple Vitamin (MULTIVITAMIN) tablet Take 1 tablet by mouth daily.     promethazine (PHENERGAN) 25 MG tablet TAKE 1 TABLET BY MOUTH EVERY 6 HOURS AS NEEDED FOR NAUSEA AND VOMITING 30 tablet 1   traZODone (DESYREL) 50 MG tablet Take 1 tablet (50 mg total) by mouth at bedtime as needed for sleep. 1 tablet 0   VELIVET  0.1/0.125/0.15 -0.025 MG tablet Take 1 tablet by mouth daily.     venlafaxine (EFFEXOR) 75 MG tablet Take 1 tablet by mouth once daily 90 tablet 0   No facility-administered medications prior to visit.    Allergies  Allergen Reactions   Tape Dermatitis    Skin irritation    Review of Systems CONSTITUTIONAL: see HPI E/N/T: see HPI CARDIOVASCULAR: Negative for chest pain,  RESPIRATORY: see HPI GASTROINTESTINAL: Negative for abdominal pain, acid reflux symptoms, constipation, diarrhea, nausea and vomiting.          Objective:  PHYSICAL EXAM:   VS: BP 108/74 (BP Location: Left Arm, Patient Position: Sitting, Cuff Size: Normal)   Pulse 90   Temp (!) 97.1 F (36.2 C) (Temporal)   Ht _0  (1.676 m)   Wt 121 lb 9.6 oz (55.2 kg)   SpO2 98%   BMI 19.63 kg/m   GEN: Well nourished, well developed, in no acute distress  HEENT: normal external ears and nose - normal external auditory canals and TMS -  - Lips, Teeth and Gums - normal  Oropharynx - mild erythema/pnd Cardiac: RRR; no murmurs, Respiratory:  normal respiratory rate and pattern with no distress - normal breath sounds with no rales, rhonchi, wheezes or rubs Skin: warm and dry, no rash   Office Visit on 09/05/2022  Component Date Value Ref Range Status   SARS Coronavirus 2 Ag 09/05/2022 Negative  Negative Final   Influenza A, POC 09/05/2022 Negative  Negative Final   Influenza B, POC 09/05/2022 Negative  Negative Final    Health Maintenance Due  Topic Date Due   PAP SMEAR-Modifier  Never done   INFLUENZA VACCINE  05/09/2022    There are no preventive care reminders to display for this patient.   Lab Results  Component Value Date   TSH 0.907 02/04/2021   Lab Results  Component Value Date   WBC 6.1 02/04/2021   HGB 14.2 02/04/2021   HCT 42.9 02/04/2021   MCV 99 (H) 02/04/2021   PLT 270 02/04/2021   Lab Results  Component Value Date   NA 134 02/04/2021   K 4.7 02/04/2021   CO2 23 02/04/2021    GLUCOSE 83 02/04/2021   BUN 14 02/04/2021   CREATININE 0.86 02/04/2021   BILITOT 0.3 02/04/2021   ALKPHOS 44 02/04/2021   AST 21 02/04/2021   ALT 15 02/04/2021   PROT 6.5 02/04/2021   ALBUMIN  4.4 02/04/2021   CALCIUM 9.4 02/04/2021   EGFR 91 02/04/2021   Lab Results  Component Value Date   CHOL 156 02/04/2021   Lab Results  Component Value Date   HDL 84 02/04/2021   Lab Results  Component Value Date   LDLCALC 55 02/04/2021   Lab Results  Component Value Date   TRIG 93 02/04/2021   Lab Results  Component Value Date   CHOLHDL 1.9 02/04/2021   No results found for: "HGBA1C"     Assessment & Plan:   Problem List Items Addressed This Visit       Respiratory   Acute upper respiratory infection - Primary   Relevant Medications   amoxicillin (AMOXIL) 875 MG tablet   Other Relevant Orders   POC COVID-19 BinaxNow (Completed)   POCT Influenza A/B (Completed) May use zyrtec or claritin      Other   Less than [redacted] weeks gestation of pregnancy Follow up with GYN as directed Stop flexeril, bcp, valium, lunesta, atrovent spray, ativan, magnesium and desyrel   Meds ordered this encounter  Medications   amoxicillin (AMOXIL) 875 MG tablet    Sig: Take 1 tablet (875 mg total) by mouth 2 (two) times daily for 10 days.    Dispense:  20 tablet    Refill:  0    Order Specific Question:   Supervising Provider    AnswerShelton Silvas    Orders Placed This Encounter  Procedures   POC COVID-19 BinaxNow   POCT Influenza A/B     Follow-up: Return if symptoms worsen or fail to improve.  An After Visit Summary was printed and given to the patient.  Yetta Flock [facility] [phone]

## 2022-09-21 DIAGNOSIS — Z3A08 8 weeks gestation of pregnancy: Secondary | ICD-10-CM | POA: Diagnosis not present

## 2022-09-21 DIAGNOSIS — O09511 Supervision of elderly primigravida, first trimester: Secondary | ICD-10-CM | POA: Diagnosis not present

## 2022-09-21 DIAGNOSIS — K5909 Other constipation: Secondary | ICD-10-CM | POA: Diagnosis not present

## 2022-09-21 DIAGNOSIS — O218 Other vomiting complicating pregnancy: Secondary | ICD-10-CM | POA: Diagnosis not present

## 2022-09-21 DIAGNOSIS — N914 Secondary oligomenorrhea: Secondary | ICD-10-CM | POA: Diagnosis not present

## 2022-09-21 DIAGNOSIS — O99611 Diseases of the digestive system complicating pregnancy, first trimester: Secondary | ICD-10-CM | POA: Diagnosis not present

## 2022-10-09 DIAGNOSIS — Z419 Encounter for procedure for purposes other than remedying health state, unspecified: Secondary | ICD-10-CM | POA: Diagnosis not present

## 2022-10-23 ENCOUNTER — Other Ambulatory Visit: Payer: Self-pay | Admitting: Family Medicine

## 2022-11-02 DIAGNOSIS — Z36 Encounter for antenatal screening for chromosomal anomalies: Secondary | ICD-10-CM | POA: Diagnosis not present

## 2022-11-09 DIAGNOSIS — Z419 Encounter for procedure for purposes other than remedying health state, unspecified: Secondary | ICD-10-CM | POA: Diagnosis not present

## 2022-11-16 DIAGNOSIS — Z3400 Encounter for supervision of normal first pregnancy, unspecified trimester: Secondary | ICD-10-CM | POA: Diagnosis not present

## 2022-11-22 ENCOUNTER — Telehealth: Payer: Self-pay

## 2022-11-22 DIAGNOSIS — J069 Acute upper respiratory infection, unspecified: Secondary | ICD-10-CM | POA: Diagnosis not present

## 2022-11-22 DIAGNOSIS — Z6821 Body mass index (BMI) 21.0-21.9, adult: Secondary | ICD-10-CM | POA: Diagnosis not present

## 2022-11-22 NOTE — Telephone Encounter (Signed)
Patient called this morning requesting an appointment for body aches and sore throat. Patient notified that we do not have any openings for today. It was recommended for the patient to go to UC or to do an E-visit through her MyChart.

## 2022-12-08 DIAGNOSIS — Z419 Encounter for procedure for purposes other than remedying health state, unspecified: Secondary | ICD-10-CM | POA: Diagnosis not present

## 2023-01-08 DIAGNOSIS — Z419 Encounter for procedure for purposes other than remedying health state, unspecified: Secondary | ICD-10-CM | POA: Diagnosis not present

## 2023-02-07 DIAGNOSIS — Z419 Encounter for procedure for purposes other than remedying health state, unspecified: Secondary | ICD-10-CM | POA: Diagnosis not present

## 2023-02-12 ENCOUNTER — Ambulatory Visit: Payer: BC Managed Care – PPO | Admitting: Family Medicine

## 2023-03-10 DIAGNOSIS — Z419 Encounter for procedure for purposes other than remedying health state, unspecified: Secondary | ICD-10-CM | POA: Diagnosis not present

## 2023-03-29 NOTE — Progress Notes (Deleted)
Subjective:  Patient ID: Rebecca Wallace, female    DOB: April 25, 1986  Age: 37 y.o. MRN: [id number]  No chief complaint on file.   HPI        08/08/2022    3:30 PM 02/02/2022    3:45 PM 07/05/2021    2:18 PM 01/26/2021   11:07 AM  Depression screen PHQ 2/9  Decreased Interest 0 0 0 0  Down, Depressed, Hopeless 0 0 0 0  PHQ - 2 Score 0 0 0 0  Altered sleeping 0 2 3   Tired, decreased energy 0 0 0   Change in appetite 0 0 0   Feeling bad or failure about yourself  0 0 0   Trouble concentrating 0 0 0   Moving slowly or fidgety/restless 0 0 0   Suicidal thoughts 0 0 0   PHQ-9 Score 0 2 3   Difficult doing work/chores Not difficult at all  Not difficult at all         08/04/2021    1:46 PM  Fall Risk   Falls in the past year? 0  Number falls in past yr: 0  Injury with Fall? 0  Risk for fall due to : No Fall Risks  Follow up Falls evaluation completed    Patient Care Team: Blane Ohara, MD as PCP - General (Family Medicine) Marylen Ponto, DO (Obstetrics and Gynecology)   Review of Systems  Current Outpatient Medications on File Prior to Visit  Medication Sig Dispense Refill   cetirizine (ZYRTEC) 10 MG tablet Take 10 mg by mouth daily.     Multiple Vitamin (MULTIVITAMIN) tablet Take 1 tablet by mouth daily.     promethazine (PHENERGAN) 25 MG tablet TAKE 1 TABLET BY MOUTH EVERY 6 HOURS AS NEEDED FOR NAUSEA AND VOMITING 30 tablet 0   venlafaxine (EFFEXOR) 75 MG tablet Take 1 tablet by mouth once daily 90 tablet 0   No current facility-administered medications on file prior to visit.   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)    GERD (gastroesophageal reflux disease)    History of cervical cancer    Idiopathic hypotension    Insomnia due to other mental disorder    Lyme disease    Major depressive disorder, recurrent, moderate (HCC)    Migraine without aura, not intractable, without status migrainosus     Other idiopathic scoliosis, lumbar region    Premenstrual tension syndrome    Small intestinal bacterial overgrowth    Xerosis cutis    Past Surgical History:  Procedure Laterality Date   ANAL FISSURE REPAIR  01/11/2019   BREAST ENHANCEMENT SURGERY  08/18/2009   CHOLECYSTECTOMY  2018   GANGLION CYST EXCISION     Left hand 2005   KNEE SURGERY Left 12/2017   SINOSCOPY  09/20/2011   TONSILLECTOMY  10/2007    Family History  Problem Relation Age of Onset   Diabetes Mother    Sjogren's syndrome Mother    High blood pressure Mother    Anxiety disorder Mother    Depression Mother    Diabetes Father    High blood pressure Maternal Grandmother    Heart Problems Maternal Grandmother    Heart attack Maternal Grandfather    COPD Other    Social History   Socioeconomic History   Marital status: Single    Spouse name: Not on file   Number of children: Not on file  Years of education: Not on file   Highest education level: Not on file  Occupational History   Occupation: book Biomedical engineer  Tobacco Use   Smoking status: Former    Packs/day: 1.00    Years: 10.00    Additional pack years: 0.00    Total pack years: 10.00    Types: Cigarettes    Quit date: 2016    Years since quitting: 8.4   Smokeless tobacco: Never  Vaping Use   Vaping Use: Every day   Devices: E-cig  Substance and Sexual Activity   Alcohol use: Yes    Comment: Seldom   Drug use: Never   Sexual activity: Not Currently  Other Topics Concern   Not on file  Social History Narrative   Not on file   Social Determinants of Health   Financial Resource Strain: Not on file  Food Insecurity: Not on file  Transportation Needs: Not on file  Physical Activity: Not on file  Stress: Not on file  Social Connections: Not on file    Objective:  There were no vitals taken for this visit.     09/05/2022   10:54 AM 08/08/2022    3:21 PM 03/22/2022    3:49 PM  BP/Weight  Systolic BP 108 100 98  Diastolic BP 74  62 62  Wt. (Lbs) 121.6 124.4 124  BMI 19.63 kg/m2 20.08 kg/m2 20.01 kg/m2    Physical Exam  Diabetic Foot Exam - Simple   No data filed      Lab Results  Component Value Date   WBC 6.1 02/04/2021   HGB 14.2 02/04/2021   HCT 42.9 02/04/2021   PLT 270 02/04/2021   GLUCOSE 83 02/04/2021   CHOL 156 02/04/2021   TRIG 93 02/04/2021   HDL 84 02/04/2021   LDLCALC 55 02/04/2021   ALT 15 02/04/2021   AST 21 02/04/2021   NA 134 02/04/2021   K 4.7 02/04/2021   CL 100 02/04/2021   CREATININE 0.86 02/04/2021   BUN 14 02/04/2021   CO2 23 02/04/2021   TSH 0.907 02/04/2021      Assessment & Plan:    There are no diagnoses linked to this encounter.   No orders of the defined types were placed in this encounter.   No orders of the defined types were placed in this encounter.    Follow-up: No follow-ups on file.   I,Waldron Gerry A Bryanah Sidell,acting as a scribe for Blane Ohara, MD.,have documented all relevant documentation on the behalf of Blane Ohara, MD,as directed by  Blane Ohara, MD while in the presence of Blane Ohara, MD.   An After Visit Summary was printed and given to the patient.  Blane Ohara, MD [facility] [phone]

## 2023-03-30 ENCOUNTER — Ambulatory Visit: Payer: Medicaid Other | Admitting: Family Medicine

## 2023-04-09 DIAGNOSIS — Z419 Encounter for procedure for purposes other than remedying health state, unspecified: Secondary | ICD-10-CM | POA: Diagnosis not present

## 2023-04-20 ENCOUNTER — Ambulatory Visit: Payer: Medicaid Other | Admitting: Family Medicine

## 2023-05-10 DIAGNOSIS — Z419 Encounter for procedure for purposes other than remedying health state, unspecified: Secondary | ICD-10-CM | POA: Diagnosis not present

## 2023-06-10 DIAGNOSIS — Z419 Encounter for procedure for purposes other than remedying health state, unspecified: Secondary | ICD-10-CM | POA: Diagnosis not present

## 2023-07-09 NOTE — Progress Notes (Unsigned)
Subjective:  Patient ID: Rebecca Wallace, female    DOB: [birth year]  Age: 37 y.o. MRN: [id number]  No chief complaint on file.   HPI   Depression/Anxiety: Taking effexor 75 mg daily     08/08/2022    3:30 PM 02/02/2022    3:45 PM 07/05/2021    2:18 PM 01/26/2021   11:07 AM  Depression screen PHQ 2/9  Decreased Interest 0 0 0 0  Down, Depressed, Hopeless 0 0 0 0  PHQ - 2 Score 0 0 0 0  Altered sleeping 0 2 3   Tired, decreased energy 0 0 0   Change in appetite 0 0 0   Feeling bad or failure about yourself  0 0 0   Trouble concentrating 0 0 0   Moving slowly or fidgety/restless 0 0 0   Suicidal thoughts 0 0 0   PHQ-9 Score 0 2 3   Difficult doing work/chores Not difficult at all  Not difficult at all         08/04/2021    1:46 PM  Fall Risk   Falls in the past year? 0  Number falls in past yr: 0  Injury with Fall? 0  Risk for fall due to : No Fall Risks  Follow up Falls evaluation completed    Patient Care Team: Blane Ohara, MD as PCP - General (Family Medicine) Marylen Ponto, DO (Obstetrics and Gynecology)   Review of Systems  Constitutional:  Negative for chills, fatigue and fever.  HENT:  Negative for congestion, ear pain, rhinorrhea and sore throat.   Respiratory:  Negative for cough and shortness of breath.   Cardiovascular:  Negative for chest pain.  Gastrointestinal:  Negative for abdominal pain, constipation, diarrhea, nausea and vomiting.  Genitourinary:  Negative for dysuria and urgency.  Musculoskeletal:  Negative for back pain and myalgias.  Neurological:  Negative for dizziness, weakness, light-headedness and headaches.  Psychiatric/Behavioral:  Negative for dysphoric mood. The patient is not nervous/anxious.     Current Outpatient Medications on File Prior to Visit  Medication Sig Dispense Refill   cetirizine (ZYRTEC) 10 MG tablet Take 10 mg by mouth daily.     Multiple Vitamin (MULTIVITAMIN) tablet Take 1 tablet by mouth daily.      promethazine (PHENERGAN) 25 MG tablet TAKE 1 TABLET BY MOUTH EVERY 6 HOURS AS NEEDED FOR NAUSEA AND VOMITING 30 tablet 0   venlafaxine (EFFEXOR) 75 MG tablet Take 1 tablet by mouth once daily 90 tablet 0   No current facility-administered medications on file prior to visit.   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)    GERD (gastroesophageal reflux disease)    History of cervical cancer    Idiopathic hypotension    Insomnia due to other mental disorder    Lyme disease    Major depressive disorder, recurrent, moderate (HCC)    Migraine without aura, not intractable, without status migrainosus    Other idiopathic scoliosis, lumbar region    Premenstrual tension syndrome    Small intestinal bacterial overgrowth    Xerosis cutis    Past Surgical History:  Procedure Laterality Date   ANAL FISSURE REPAIR  01/11/2019   BREAST ENHANCEMENT SURGERY  08/18/2009   CHOLECYSTECTOMY  2018   GANGLION CYST EXCISION     Left hand 2005   KNEE SURGERY Left 12/2017   SINOSCOPY  09/20/2011   TONSILLECTOMY  10/2007  Family History  Problem Relation Age of Onset   Diabetes Mother    Sjogren's syndrome Mother    High blood pressure Mother    Anxiety disorder Mother    Depression Mother    Diabetes Father    High blood pressure Maternal Grandmother    Heart Problems Maternal Grandmother    Heart attack Maternal Grandfather    COPD Other    Social History   Socioeconomic History   Marital status: Single    Spouse name: Not on file   Number of children: Not on file   Years of education: Not on file   Highest education level: Not on file  Occupational History   Occupation: book Biomedical engineer  Tobacco Use   Smoking status: Former    Current packs/day: 0.00    Average packs/day: 1 pack/day for 10.0 years (10.0 ttl pk-yrs)    Types: Cigarettes    Start date: 2006    Quit date: 2016    Years since quitting: 8.7   Smokeless tobacco:  Never  Vaping Use   Vaping status: Every Day   Devices: E-cig  Substance and Sexual Activity   Alcohol use: Yes    Comment: Seldom   Drug use: Never   Sexual activity: Not Currently  Other Topics Concern   Not on file  Social History Narrative   Not on file   Social Determinants of Health   Financial Resource Strain: Not on file  Food Insecurity: Not on file  Transportation Needs: Not on file  Physical Activity: Not on file  Stress: Not on file  Social Connections: Not on file    Objective:  There were no vitals taken for this visit.     09/05/2022   10:54 AM 08/08/2022    3:21 PM 03/22/2022    3:49 PM  BP/Weight  Systolic BP 108 100 98  Diastolic BP 74 62 62  Wt. (Lbs) 121.6 124.4 124  BMI 19.63 kg/m2 20.08 kg/m2 20.01 kg/m2    Physical Exam Vitals reviewed.  Constitutional:      Appearance: Normal appearance. She is normal weight.  Neck:     Vascular: No carotid bruit.  Cardiovascular:     Rate and Rhythm: Normal rate and regular rhythm.     Heart sounds: Normal heart sounds.  Pulmonary:     Effort: Pulmonary effort is normal. No respiratory distress.     Breath sounds: Normal breath sounds.  Abdominal:     General: Abdomen is flat. Bowel sounds are normal.     Palpations: Abdomen is soft.     Tenderness: There is no abdominal tenderness.  Neurological:     Mental Status: She is alert and oriented to person, place, and time.  Psychiatric:        Mood and Affect: Mood normal.        Behavior: Behavior normal.     Diabetic Foot Exam - Simple   No data filed      Lab Results  Component Value Date   WBC 6.1 02/04/2021   HGB 14.2 02/04/2021   HCT 42.9 02/04/2021   PLT 270 02/04/2021   GLUCOSE 83 02/04/2021   CHOL 156 02/04/2021   TRIG 93 02/04/2021   HDL 84 02/04/2021   LDLCALC 55 02/04/2021   ALT 15 02/04/2021   AST 21 02/04/2021   NA 134 02/04/2021   K 4.7 02/04/2021   CL 100 02/04/2021   CREATININE 0.86 02/04/2021   BUN 14 02/04/2021  CO2 23 02/04/2021   TSH 0.907 02/04/2021      Assessment & Plan:    There are no diagnoses linked to this encounter.   No orders of the defined types were placed in this encounter.   No orders of the defined types were placed in this encounter.    Follow-up: No follow-ups on file.   I,Katherina A Bramblett,acting as a scribe for Blane Ohara, MD.,have documented all relevant documentation on the behalf of Blane Ohara, MD,as directed by  Blane Ohara, MD while in the presence of Blane Ohara, MD.   An After Visit Summary was printed and given to the patient.  Blane Ohara, MD [facility] [phone]

## 2023-07-10 ENCOUNTER — Encounter: Payer: Self-pay | Admitting: Family Medicine

## 2023-07-10 ENCOUNTER — Ambulatory Visit (INDEPENDENT_AMBULATORY_CARE_PROVIDER_SITE_OTHER): Payer: BC Managed Care – PPO | Admitting: Family Medicine

## 2023-07-10 VITALS — BP 118/76 | HR 84 | Temp 97.1°F | Resp 16 | Ht 66.0 in | Wt 133.0 lb

## 2023-07-10 DIAGNOSIS — F5101 Primary insomnia: Secondary | ICD-10-CM

## 2023-07-10 DIAGNOSIS — Z3A00 Weeks of gestation of pregnancy not specified: Secondary | ICD-10-CM | POA: Diagnosis not present

## 2023-07-10 DIAGNOSIS — O9270 Unspecified disorders of lactation: Secondary | ICD-10-CM | POA: Diagnosis not present

## 2023-07-10 DIAGNOSIS — J018 Other acute sinusitis: Secondary | ICD-10-CM

## 2023-07-10 DIAGNOSIS — F330 Major depressive disorder, recurrent, mild: Secondary | ICD-10-CM

## 2023-07-10 DIAGNOSIS — F411 Generalized anxiety disorder: Secondary | ICD-10-CM

## 2023-07-10 DIAGNOSIS — J019 Acute sinusitis, unspecified: Secondary | ICD-10-CM | POA: Insufficient documentation

## 2023-07-10 DIAGNOSIS — Z419 Encounter for procedure for purposes other than remedying health state, unspecified: Secondary | ICD-10-CM | POA: Diagnosis not present

## 2023-07-10 DIAGNOSIS — J0100 Acute maxillary sinusitis, unspecified: Secondary | ICD-10-CM | POA: Insufficient documentation

## 2023-07-10 MED ORDER — FLUCONAZOLE 150 MG PO TABS: 150.0000 mg | ORAL_TABLET | Freq: Once | ORAL | 0 refills | Status: AC

## 2023-07-10 MED ORDER — METOCLOPRAMIDE HCL 10 MG PO TABS
10.0000 mg | ORAL_TABLET | Freq: Four times a day (QID) | ORAL | 0 refills | Status: DC
Start: 2023-07-10 — End: 2023-09-17

## 2023-07-10 MED ORDER — AMOXICILLIN 875 MG PO TABS: 875.0000 mg | ORAL_TABLET | Freq: Two times a day (BID) | ORAL | 0 refills | Status: AC

## 2023-07-10 NOTE — Assessment & Plan Note (Signed)
Fenugreek (naturemade.) Oatmeal. Hydration. Reglan 10 mg four times a day.

## 2023-07-10 NOTE — Patient Instructions (Addendum)
Fenugreek (naturemade.) Oatmeal. Hydration. Reglan 10 mg four times a day.   Amoxicillin for sinus infection.  Fluconazole given.

## 2023-07-10 NOTE — Assessment & Plan Note (Signed)
Amoxicillin for sinus infection.  Fluconazole given.

## 2023-07-11 ENCOUNTER — Other Ambulatory Visit: Payer: Self-pay | Admitting: Family Medicine

## 2023-07-26 LAB — RESULTS CONSOLE HPV: CHL HPV: POSITIVE

## 2023-07-26 LAB — HM PAP SMEAR: HM Pap smear: NEGATIVE

## 2023-08-10 DIAGNOSIS — Z419 Encounter for procedure for purposes other than remedying health state, unspecified: Secondary | ICD-10-CM | POA: Diagnosis not present

## 2023-08-16 ENCOUNTER — Encounter: Payer: Self-pay | Admitting: Family Medicine

## 2023-08-17 ENCOUNTER — Encounter: Payer: Self-pay | Admitting: Physician Assistant

## 2023-08-17 ENCOUNTER — Ambulatory Visit (INDEPENDENT_AMBULATORY_CARE_PROVIDER_SITE_OTHER): Payer: BC Managed Care – PPO | Admitting: Physician Assistant

## 2023-08-17 VITALS — BP 102/72 | HR 83 | Temp 97.2°F | Ht 66.0 in | Wt 129.0 lb

## 2023-08-17 DIAGNOSIS — J060 Acute laryngopharyngitis: Secondary | ICD-10-CM

## 2023-08-17 MED ORDER — FLUCONAZOLE 150 MG PO TABS: 150.0000 mg | ORAL_TABLET | Freq: Every day | ORAL | 1 refills | Status: DC

## 2023-08-17 MED ORDER — AZITHROMYCIN 250 MG PO TABS: ORAL_TABLET | ORAL | 0 refills | Status: AC

## 2023-08-17 NOTE — Progress Notes (Signed)
   Acute Office Visit  Subjective:    Patient ID: Rebecca Wallace, female    DOB: 27-Jan-1986, 37 y.o.   MRN: [id number]  Chief Complaint  Patient presents with   Sinusitis    HPI: Patient is in today for complaints of sinus congestion, sore throat, ears stopped up for the past several days -  Has had some cough Denies fever - has had some malaise   Current Outpatient Medications:    azithromycin (ZITHROMAX) 250 MG tablet, Take 2 tablets on day 1, then 1 tablet daily on days 2 through 5, Disp: 6 tablet, Rfl: 0   fluconazole (DIFLUCAN) 150 MG tablet, Take 1 tablet (150 mg total) by mouth daily., Disp: 1 tablet, Rfl: 1   ipratropium (ATROVENT) 0.06 % nasal spray, USE 2 SPRAY(S) IN EACH NOSTRIL THREE TIMES DAILY, Disp: 15 mL, Rfl: 0   metoCLOPramide (REGLAN) 10 MG tablet, Take 1 tablet (10 mg total) by mouth 4 (four) times daily., Disp: 120 tablet, Rfl: 0   Multiple Vitamin (MULTIVITAMIN) tablet, Take 1 tablet by mouth daily., Disp: , Rfl:    promethazine (PHENERGAN) 25 MG tablet, TAKE 1 TABLET BY MOUTH EVERY 6 HOURS AS NEEDED FOR NAUSEA AND VOMITING, Disp: 30 tablet, Rfl: 0  Allergies  Allergen Reactions   Tape Dermatitis    Skin irritation    ROS CONSTITUTIONAL: see HPI E/N/T: see HPI CARDIOVASCULAR: Negative for chest pain, dizziness,  RESPIRATORY: see HPI GASTROINTESTINAL: Negative for abdominal pain, acid reflux symptoms, constipation, diarrhea, nausea and vomiting.   INTEGUMENTARY: Negative for rash.  NEUROLOGICAL: Negative for dizziness and headaches.  PSYCHIATRIC: Negative for sleep disturbance and to question depression screen.  Negative for depression, negative for anhedonia.      Objective:    PHYSICAL EXAM:   BP 102/72 (BP Location: Left Arm, Patient Position: Sitting)   Pulse 83   Temp (!) 97.2 F (36.2 C) (Temporal)   Ht 5\' 6"  (1.676 m)   Wt 129 lb (58.5 kg)   SpO2 99%   BMI 20.82 kg/m    GEN: Well nourished, well developed, in no acute distress   HEENT: normal external ears and nose - normal external auditory canals and TMS - hearing grossly normal -  - Lips, Teeth and Gums - normal  Oropharynx -erythema/pnd Cardiac: RRR; no murmurs,  Respiratory:  normal respiratory rate and pattern with no distress - normal breath sounds with no rales, rhonchi, wheezes or rubs GI: normal bowel sounds, no masses or tenderness      Assessment & Plan:    Acute laryngopharyngitis -     Azithromycin; Take 2 tablets on day 1, then 1 tablet daily on days 2 through 5  Dispense: 6 tablet; Refill: 0 Otc decongestants Other orders -     Fluconazole; Take 1 tablet (150 mg total) by mouth daily.  Dispense: 1 tablet; Refill: 1     Follow-up: Return if symptoms worsen or fail to improve.  An After Visit Summary was printed and given to the patient.  Jettie Pagan [facility] [phone]

## 2023-08-20 ENCOUNTER — Other Ambulatory Visit: Payer: Self-pay

## 2023-08-20 MED ORDER — AMOXICILLIN 875 MG PO TABS: 875.0000 mg | ORAL_TABLET | Freq: Two times a day (BID) | ORAL | 0 refills | Status: DC

## 2023-09-09 DIAGNOSIS — Z419 Encounter for procedure for purposes other than remedying health state, unspecified: Secondary | ICD-10-CM | POA: Diagnosis not present

## 2023-09-17 ENCOUNTER — Ambulatory Visit (INDEPENDENT_AMBULATORY_CARE_PROVIDER_SITE_OTHER): Payer: BC Managed Care – PPO

## 2023-09-17 ENCOUNTER — Ambulatory Visit: Payer: BC Managed Care – PPO | Admitting: Family Medicine

## 2023-09-17 VITALS — BP 100/62 | HR 80 | Temp 98.4°F | Resp 18 | Ht 66.0 in | Wt 127.8 lb

## 2023-09-17 DIAGNOSIS — J3489 Other specified disorders of nose and nasal sinuses: Secondary | ICD-10-CM | POA: Diagnosis not present

## 2023-09-17 DIAGNOSIS — J329 Chronic sinusitis, unspecified: Secondary | ICD-10-CM | POA: Diagnosis not present

## 2023-09-17 MED ORDER — IPRATROPIUM BROMIDE 0.06 % NA SOLN: 2.0000 | Freq: Three times a day (TID) | NASAL | 2 refills | Status: AC

## 2023-09-17 MED ORDER — AMOXICILLIN 500 MG PO CAPS: 500.0000 mg | ORAL_CAPSULE | Freq: Three times a day (TID) | ORAL | 0 refills | Status: AC

## 2023-09-17 NOTE — Patient Instructions (Signed)
VISIT SUMMARY:  During today's visit, we discussed your worsening chest discomfort, coughing, and sore throat due to postnasal drainage. We reviewed your history of chronic sinus infections and recent treatments. Based on your symptoms and physical exam, we determined that you have an acute viral upper respiratory infection and chronic sinusitis.  YOUR PLAN:  -ACUTE VIRAL UPPER RESPIRATORY INFECTION: An acute viral upper respiratory infection is a common viral infection that affects the nose, throat, and airways. It typically resolves on its own within a couple of weeks. We recommend continuing supportive treatments such as drinking plenty of fluids, using steam inhalation, and nasal saline sprays. We have prescribed an antibiotic for you to use only if your symptoms persist beyond 10-12 days or if you develop a high fever and thick nasal drainage. We also discussed the risks of unnecessary antibiotic use, including resistance and potential side effects.  -CHRONIC SINUSITIS: Chronic sinusitis is a long-term inflammation of the sinuses that can cause congestion, pain, and drainage. Given your history and current symptoms, we recommend a referral to an ENT specialist for further evaluation and possibly an updated CT scan of your sinuses. We have refilled your prescription for ipratropium nasal spray, which you should use as directed: 2 sprays in each nostril, three times daily.  INSTRUCTIONS:  We have put in a referral to ENT at [facility]. Please reach out to them to schedule an appointment. Use the prescribed antibiotic only if your symptoms persist beyond 10-12 days or if you develop a high fever and thick nasal drainage.

## 2023-09-17 NOTE — Progress Notes (Signed)
Acute Office Visit  Subjective:    Patient ID: Rebecca Wallace, female    DOB: 1985-11-20, 37 y.o.   MRN: [id number]  Chief Complaint  Patient presents with   Sinusitis   Cough   Headache   Sore Throat    Discussed the use of AI scribe software for clinical note transcription with the patient, who gave verbal consent to proceed.      HPI: The patient, with a history of chronic sinus infections, presents with worsening chest discomfort and coughing. She reports a sore throat due to postnasal drainage, which is causing itchiness and sleep disturbances. The patient also describes feeling generally achy. The symptoms began abruptly upon waking on a Saturday, following a normal day on Friday. She denies having a fever but feels feverish.  The patient has been treated with two courses of amoxicillin in the past two months, first in October and then in November. She also tried azithromycin, but it was ineffective. Over-the-counter remedies, such as Tylenol, have provided some relief.  The patient has a history of sinus surgery in 2012 and was evaluated by an ENT two years ago. At that time, the ENT did not recommend further surgery and prescribed a nasal spray, which the patient continues to use. Despite these interventions, the patient continues to experience chronic sinus symptoms.  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)    GERD (gastroesophageal reflux disease)    History of cervical cancer    Idiopathic hypotension    Insomnia due to other mental disorder    Lyme disease    Major depressive disorder, recurrent, moderate (HCC)    Migraine without aura, not intractable, without status migrainosus    Other idiopathic scoliosis, lumbar region    Premenstrual tension syndrome    Small intestinal bacterial overgrowth    Xerosis cutis     Past Surgical History:  Procedure Laterality Date   ANAL FISSURE REPAIR   01/11/2019   BREAST ENHANCEMENT SURGERY  08/18/2009   CESAREAN SECTION  03/29/2023   CHOLECYSTECTOMY  2018   GANGLION CYST EXCISION     Left hand 2005   KNEE SURGERY Left 12/2017   SINOSCOPY  09/20/2011   TONSILLECTOMY  10/2007    Family History  Problem Relation Age of Onset   Diabetes Mother    Sjogren's syndrome Mother    High blood pressure Mother    Anxiety disorder Mother    Depression Mother    Diabetes Father    High blood pressure Maternal Grandmother    Heart Problems Maternal Grandmother    Heart attack Maternal Grandfather    COPD Other     Social History   Socioeconomic History   Marital status: Single    Spouse name: Not on file   Number of children: Not on file   Years of education: Not on file   Highest education level: Not on file  Occupational History   Occupation: book Biomedical engineer  Tobacco Use   Smoking status: Former    Current packs/day: 0.00    Average packs/day: 1 pack/day for 10.0 years (10.0 ttl pk-yrs)    Types: Cigarettes    Start date: 2006    Quit date: 2016    Years since quitting: 8.9   Smokeless tobacco: Never  Vaping Use   Vaping status: Every Day   Devices: E-cig  Substance and Sexual Activity   Alcohol use: Yes  Comment: Seldom   Drug use: Never   Sexual activity: Not Currently  Other Topics Concern   Not on file  Social History Narrative   Not on file   Social Determinants of Health   Financial Resource Strain: Not on file  Food Insecurity: Not on file  Transportation Needs: Not on file  Physical Activity: Not on file  Stress: Not on file  Social Connections: Not on file  Intimate Partner Violence: Not on file    Outpatient Medications Prior to Visit  Medication Sig Dispense Refill   Multiple Vitamin (MULTIVITAMIN) tablet Take 1 tablet by mouth daily.     promethazine (PHENERGAN) 25 MG tablet TAKE 1 TABLET BY MOUTH EVERY 6 HOURS AS NEEDED FOR NAUSEA AND VOMITING 30 tablet 0   ipratropium (ATROVENT) 0.06 % nasal  spray USE 2 SPRAY(S) IN EACH NOSTRIL THREE TIMES DAILY 15 mL 0   amoxicillin (AMOXIL) 875 MG tablet Take 1 tablet (875 mg total) by mouth 2 (two) times daily. 20 tablet 0   fluconazole (DIFLUCAN) 150 MG tablet Take 1 tablet (150 mg total) by mouth daily. 1 tablet 1   metoCLOPramide (REGLAN) 10 MG tablet Take 1 tablet (10 mg total) by mouth 4 (four) times daily. 120 tablet 0   No facility-administered medications prior to visit.    Allergies  Allergen Reactions   Tape Dermatitis    Skin irritation    Review of Systems  Constitutional:  Positive for chills and fatigue.  HENT:  Positive for congestion, ear pain, postnasal drip, sinus pressure, sinus pain and sore throat.   Respiratory:  Positive for cough, chest tightness and shortness of breath.        Objective:        09/17/2023    9:38 AM 08/17/2023    9:41 AM 07/10/2023    2:51 PM  Vitals with BMI  Height 5\' 6"  5\' 6"  5\' 6"   Weight 127 lbs 13 oz 129 lbs 133 lbs  BMI 20.64 20.83 21.48  Systolic 100 102 885  Diastolic 62 72 76  Pulse 80 83 84    Orthostatic VS for the past 72 hrs (Last 3 readings):  Patient Position BP Location Cuff Size  09/17/23 0938 Sitting Right Arm Normal     Physical Exam Vitals and nursing note reviewed.  Constitutional:      Appearance: She is well-developed.     Comments: HEENT: Nasal mucosa inflamed. Oral cavity normal. Right TM bulging, no signs of infection. Normal left TM CHEST: Lungs clear to auscultation.  HENT:     Head:     Comments: Bilateral maxillary sinus tenderness Inflamed nasal mucosa bilaterally Cardiovascular:     Rate and Rhythm: Normal rate and regular rhythm.  Pulmonary:     Effort: Pulmonary effort is normal.     Breath sounds: Normal breath sounds.  Neurological:     Mental Status: She is alert.  Psychiatric:        Mood and Affect: Mood normal.     Health Maintenance Due  Topic Date Due   INFLUENZA VACCINE  05/10/2023    There are no preventive care  reminders to display for this patient.   Lab Results  Component Value Date   TSH 0.907 02/04/2021   Lab Results  Component Value Date   WBC 6.1 02/04/2021   HGB 14.2 02/04/2021   HCT 42.9 02/04/2021   MCV 99 (H) 02/04/2021   PLT 270 02/04/2021   Lab Results  Component Value Date  NA 134 02/04/2021   K 4.7 02/04/2021   CO2 23 02/04/2021   GLUCOSE 83 02/04/2021   BUN 14 02/04/2021   CREATININE 0.86 02/04/2021   BILITOT 0.3 02/04/2021   ALKPHOS 44 02/04/2021   AST 21 02/04/2021   ALT 15 02/04/2021   PROT 6.5 02/04/2021   ALBUMIN 4.4 02/04/2021   CALCIUM 9.4 02/04/2021   EGFR 91 02/04/2021   Lab Results  Component Value Date   CHOL 156 02/04/2021   Lab Results  Component Value Date   HDL 84 02/04/2021   Lab Results  Component Value Date   LDLCALC 55 02/04/2021   Lab Results  Component Value Date   TRIG 93 02/04/2021   Lab Results  Component Value Date   CHOLHDL 1.9 02/04/2021   No results found for: "HGBA1C"     Assessment & Plan:  Chronic recurrent sinusitis Assessment & Plan: Symptoms include chest congestion, cough, sore throat, and sinus drainage for 3 days. No fever. Physical exam shows inflamed nasal passages without bacterial infection signs. Recent antibiotic treatments (amoxicillin in October and November) for chronic sinusitis. Advised that viral infections typically resolve in a couple of weeks and antibiotics are ineffective. Discussed risks of unnecessary antibiotic use, including resistance, diarrhea, and C. diff infections. Frequent antibiotic use may necessitate stronger antibiotics like Augmentin, which have additional side effects. - Continue supportive treatment with fluids, steam inhalation, and nasal saline sprays - Prescribed antibiotic amoxicillin AS SHE INSISTS ON IT with instructions to use only if symptoms persist beyond 10-12 days or if high fever and thick nasal drainage develop - Refer to ENT for further evaluation due to  recurrent sinusitis - Consider CT scan of sinuses if recommended by ENT  Chronic Sinusitis Chronic sinus infections with previous sinus surgery in 2012 and follow-up two years ago. Currently using ipratropium nasal spray as prescribed by ENT. Last CT scan two years ago showed adequate drainage post-surgery. Recurrence of symptoms warrants further ENT evaluation to determine if additional interventions are needed. Discussed the possibility of ordering a CT scan to check for fluid collection and evidence of sinusitis. - Refer to ENT for evaluation and possible updated CT scan of sinuses - Refill ipratropium nasal spray, 2 sprays each nostril, three times daily  Follow-up - Put in a referral to ENT at [facility] - Advise patient to reach out to ENT for an appointment.  Orders: -     Ambulatory referral to ENT  Sinus pressure -     Ambulatory referral to ENT  Other orders -     Amoxicillin; Take 1 capsule (500 mg total) by mouth 3 (three) times daily for 7 days.  Dispense: 21 capsule; Refill: 0 -     Ipratropium Bromide; Place 2 sprays into both nostrils 3 (three) times daily.  Dispense: 15 mL; Refill: 2     Meds ordered this encounter  Medications   amoxicillin (AMOXIL) 500 MG capsule    Sig: Take 1 capsule (500 mg total) by mouth 3 (three) times daily for 7 days.    Dispense:  21 capsule    Refill:  0   ipratropium (ATROVENT) 0.06 % nasal spray    Sig: Place 2 sprays into both nostrils 3 (three) times daily.    Dispense:  15 mL    Refill:  2    Orders Placed This Encounter  Procedures   Ambulatory referral to ENT     Follow-up: Return if symptoms worsen or fail to improve.  An  After Visit Summary was printed and given to the patient. I attest that I have reviewed this visit and agree with the plan scribed by my staff.   Windell Moment, MD [facility] [phone]     Windell Moment, MD [facility] [phone]

## 2023-09-17 NOTE — Assessment & Plan Note (Signed)
Symptoms include chest congestion, cough, sore throat, and sinus drainage for 3 days. No fever. Physical exam shows inflamed nasal passages without bacterial infection signs. Recent antibiotic treatments (amoxicillin in October and November) for chronic sinusitis. Advised that viral infections typically resolve in a couple of weeks and antibiotics are ineffective. Discussed risks of unnecessary antibiotic use, including resistance, diarrhea, and C. diff infections. Frequent antibiotic use may necessitate stronger antibiotics like Augmentin, which have additional side effects. - Continue supportive treatment with fluids, steam inhalation, and nasal saline sprays - Prescribed antibiotic amoxicillin AS SHE INSISTS ON IT with instructions to use only if symptoms persist beyond 10-12 days or if high fever and thick nasal drainage develop - Refer to ENT for further evaluation due to recurrent sinusitis - Consider CT scan of sinuses if recommended by ENT  Chronic Sinusitis Chronic sinus infections with previous sinus surgery in 2012 and follow-up two years ago. Currently using ipratropium nasal spray as prescribed by ENT. Last CT scan two years ago showed adequate drainage post-surgery. Recurrence of symptoms warrants further ENT evaluation to determine if additional interventions are needed. Discussed the possibility of ordering a CT scan to check for fluid collection and evidence of sinusitis. - Refer to ENT for evaluation and possible updated CT scan of sinuses - Refill ipratropium nasal spray, 2 sprays each nostril, three times daily  Follow-up - Put in a referral to ENT at [facility] - Advise patient to reach out to ENT for an appointment.

## 2023-09-25 ENCOUNTER — Other Ambulatory Visit: Payer: Self-pay | Admitting: Family Medicine

## 2023-10-10 DIAGNOSIS — Z419 Encounter for procedure for purposes other than remedying health state, unspecified: Secondary | ICD-10-CM | POA: Diagnosis not present

## 2023-10-16 ENCOUNTER — Encounter: Payer: Self-pay | Admitting: Family Medicine

## 2023-10-18 ENCOUNTER — Other Ambulatory Visit: Payer: Self-pay

## 2023-10-18 MED ORDER — DIAZEPAM 5 MG PO TABS: 5.0000 mg | ORAL_TABLET | Freq: Every evening | ORAL | 0 refills | Status: DC | PRN

## 2023-11-10 DIAGNOSIS — Z419 Encounter for procedure for purposes other than remedying health state, unspecified: Secondary | ICD-10-CM | POA: Diagnosis not present

## 2023-11-26 ENCOUNTER — Other Ambulatory Visit: Payer: Self-pay

## 2023-12-06 LAB — TSH
A1c: 5.5
Free T4: 1.45 ng/dL
TSH: 0.35 — AB (ref 0.41–5.90)

## 2023-12-08 DIAGNOSIS — Z419 Encounter for procedure for purposes other than remedying health state, unspecified: Secondary | ICD-10-CM | POA: Diagnosis not present

## 2023-12-24 NOTE — Progress Notes (Signed)
 Subjective:  Patient ID: [name], female    DOB: [birth year]  Age: 38 y.o. MRN: [id number]  Chief Complaint  Patient presents with   Medical Management of Chronic Issues   Discussed the use of AI scribe software for clinical note transcription with the patient, who gave verbal consent to proceed.  History of Present Illness   Haedyn Breau is a 38 year old female who presents with hair loss and night sweats.  She experiences significant hair loss, diagnosed as alopecia by a dermatologist. There is no family history of alopecia. She has noticed some regrowth in the most affected area.  She has symptoms consistent with hyperthyroidism, including severe night sweats, increased appetite without significant weight gain, and difficulty sleeping. She also experiences agitation and a sensation of her heart racing, particularly when stressed. No chest pain or fever.  Blood work showed a low TSH level, indicating potential hyperthyroidism. Her cardiac C-reactive protein and copper levels were elevated, while iron, mycoplasma, A1c, free T4, vitamin D, and ferritin levels were normal. She has no history of heart issues.  She is a mother to a nine-month-old child and is managing postpartum changes.         12/25/2023    1:21 PM 07/10/2023    2:58 PM 08/08/2022    3:30 PM 02/02/2022    3:45 PM 07/05/2021    2:18 PM  Depression screen PHQ 2/9  Decreased Interest 0 0 0 0 0  Down, Depressed, Hopeless 0 0 0 0 0  PHQ - 2 Score 0 0 0 0 0  Altered sleeping 3 0 0 2 3  Tired, decreased energy 3 0 0 0 0  Change in appetite 0 0 0 0 0  Feeling bad or failure about yourself  0 0 0 0 0  Trouble concentrating 0 0 0 0 0  Moving slowly or fidgety/restless 0 0 0 0 0  Suicidal thoughts 0 0 0 0 0  PHQ-9 Score 6 0 0 2 3  Difficult doing work/chores Somewhat difficult Not difficult at all Not difficult at all  Not difficult at all        07/10/2023    2:58 PM  Fall Risk   Falls in the past year? 0   Number falls in past yr: 0  Injury with Fall? 0  Risk for fall due to : No Fall Risks  Follow up Falls evaluation completed;Falls prevention discussed    Patient Care Team: CoxFritzi Mandes, MD as PCP - General (Family Medicine) Marylen Ponto, DO (Obstetrics and Gynecology)   Review of Systems  Constitutional:  Positive for diaphoresis (Night sweats). Negative for chills, fatigue and fever.  HENT:  Negative for congestion, ear pain, rhinorrhea and sore throat.   Respiratory:  Negative for cough and shortness of breath.   Cardiovascular:  Negative for chest pain.  Gastrointestinal:  Negative for abdominal pain, constipation, diarrhea, nausea and vomiting.  Genitourinary:  Negative for dysuria and urgency.  Musculoskeletal:  Negative for back pain and myalgias.  Neurological:  Negative for dizziness, weakness, light-headedness and headaches.  Psychiatric/Behavioral:  Negative for dysphoric mood. The patient is not nervous/anxious.     Current Outpatient Medications on File Prior to Visit  Medication Sig Dispense Refill   diazepam (VALIUM) 5 MG tablet TAKE 1 TABLET BY MOUTH AT BEDTIME AS NEEDED FOR UP TO 15 DAYS 15 tablet 0   ipratropium (ATROVENT) 0.06 % nasal spray Place 2 sprays into both nostrils 3 (three) times daily. 15 mL  2   Multiple Vitamin (MULTIVITAMIN) tablet Take 1 tablet by mouth daily.     promethazine (PHENERGAN) 25 MG tablet TAKE 1 TABLET BY MOUTH EVERY 6 HOURS AS NEEDED FOR NAUSEA AND VOMITING 30 tablet 0   No current facility-administered medications on file prior to visit.   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)    GERD (gastroesophageal reflux disease)    History of cervical cancer    Idiopathic hypotension    Insomnia due to other mental disorder    Lyme disease    Major depressive disorder, recurrent, moderate (HCC)    Migraine without aura, not intractable, without status migrainosus     Other idiopathic scoliosis, lumbar region    Premenstrual tension syndrome    Small intestinal bacterial overgrowth    Xerosis cutis    Past Surgical History:  Procedure Laterality Date   ANAL FISSURE REPAIR  01/11/2019   BREAST ENHANCEMENT SURGERY  08/18/2009   CESAREAN SECTION  03/29/2023   CHOLECYSTECTOMY  2018   GANGLION CYST EXCISION     Left hand 2005   KNEE SURGERY Left 12/2017   SINOSCOPY  09/20/2011   TONSILLECTOMY  10/2007    Family History  Problem Relation Age of Onset   Diabetes Mother    Sjogren's syndrome Mother    High blood pressure Mother    Anxiety disorder Mother    Depression Mother    Diabetes Father    High blood pressure Maternal Grandmother    Heart Problems Maternal Grandmother    Heart attack Maternal Grandfather    COPD Other    Social History   Socioeconomic History   Marital status: Single    Spouse name: Not on file   Number of children: Not on file   Years of education: Not on file   Highest education level: Not on file  Occupational History   Occupation: book Biomedical engineer  Tobacco Use   Smoking status: Former    Current packs/day: 0.00    Average packs/day: 1 pack/day for 10.0 years (10.0 ttl pk-yrs)    Types: Cigarettes    Start date: 2006    Quit date: 2016    Years since quitting: 9.2   Smokeless tobacco: Never  Vaping Use   Vaping status: Every Day   Devices: E-cig  Substance and Sexual Activity   Alcohol use: Yes    Comment: Seldom   Drug use: Never   Sexual activity: Not Currently  Other Topics Concern   Not on file  Social History Narrative   Not on file   Social Drivers of Health   Financial Resource Strain: Low Risk  (12/25/2023)   Overall Financial Resource Strain (CARDIA)    Difficulty of Paying Living Expenses: Not hard at all  Food Insecurity: No Food Insecurity (12/25/2023)   Hunger Vital Sign    Worried About Running Out of Food in the Last Year: Never true    Ran Out of Food in the Last Year: Never true   Transportation Needs: No Transportation Needs (12/25/2023)   PRAPARE - Administrator, Civil Service (Medical): No    Lack of Transportation (Non-Medical): No  Physical Activity: Sufficiently Active (12/25/2023)   Exercise Vital Sign    Days of Exercise per Week: 7 days    Minutes of Exercise per Session: 30 min  Stress: No Stress Concern Present (12/25/2023)   [employer] of  Occupational Health - Occupational Stress Questionnaire    Feeling of Stress : Not at all  Social Connections: Moderately Isolated (12/25/2023)   Social Connection and Isolation Panel [NHANES]    Frequency of Communication with Friends and Family: Three times a week    Frequency of Social Gatherings with Friends and Family: Three times a week    Attends Religious Services: Never    Active Member of Clubs or Organizations: No    Attends Banker Meetings: Never    Marital Status: Married    Objective:  BP 108/72   Pulse 95   Temp 98.5 F (36.9 C)   Ht 5\' 6"  (1.676 m)   Wt 128 lb (58.1 kg)   LMP 12/04/2023   SpO2 97%   Breastfeeding No   BMI 20.66 kg/m      12/25/2023    1:20 PM 09/17/2023    9:38 AM 08/17/2023    9:41 AM  BP/Weight  Systolic BP 108 100 102  Diastolic BP 72 62 72  Wt. (Lbs) 128 127.8 129  BMI 20.66 kg/m2 20.63 kg/m2 20.82 kg/m2    Physical Exam Vitals reviewed.  Constitutional:      Appearance: Normal appearance. She is normal weight.  Neck:     Vascular: No carotid bruit.  Cardiovascular:     Rate and Rhythm: Normal rate and regular rhythm.     Heart sounds: Normal heart sounds.  Pulmonary:     Effort: Pulmonary effort is normal. No respiratory distress.     Breath sounds: Normal breath sounds.  Abdominal:     General: Abdomen is flat. Bowel sounds are normal.     Palpations: Abdomen is soft.     Tenderness: There is no abdominal tenderness.  Skin:    Comments: alopecia  Neurological:     Mental Status: She is alert and oriented to person,  place, and time.  Psychiatric:        Mood and Affect: Mood normal.        Behavior: Behavior normal.     Diabetic Foot Exam - Simple   No data filed      Lab Results  Component Value Date   WBC 6.1 02/04/2021   HGB 14.2 02/04/2021   HCT 42.9 02/04/2021   PLT 270 02/04/2021   GLUCOSE 83 02/04/2021   CHOL 156 02/04/2021   TRIG 93 02/04/2021   HDL 84 02/04/2021   LDLCALC 55 02/04/2021   ALT 15 02/04/2021   AST 21 02/04/2021   NA 134 02/04/2021   K 4.7 02/04/2021   CL 100 02/04/2021   CREATININE 0.86 02/04/2021   BUN 14 02/04/2021   CO2 23 02/04/2021   TSH 0.907 02/04/2021      Assessment & Plan:   Subclinical hyperthyroidism Assessment & Plan: Start on methimazole 5 mg twice daily.   Orders: -     US THYROID; Future  Primary insomnia Assessment & Plan: Start on belsomra 10 mg before bed.   Orders: -     Belsomra; Take 1 tablet (10 mg total) by mouth at bedtime as needed.  Dispense: 30 tablet; Refill: 2  Abnormal laboratory test Assessment & Plan: Check ceruloplasmin.  Orders: -     Ceruloplasmin  Elevated C-reactive protein (CRP) Assessment & Plan: Check EKG: NSR no st changes.  Orders: -     EKG 12-Lead  Alopecia Assessment & Plan: Improving. May be stress related.  Treat hyperthyroidism.       Meds ordered this  encounter  Medications   DISCONTD: methimazole (TAPAZOLE) 5 MG tablet    Sig: Take 1 tablet (5 mg total) by mouth 2 (two) times daily.    Dispense:  60 tablet    Refill:  1   Suvorexant (BELSOMRA) 10 MG TABS    Sig: Take 1 tablet (10 mg total) by mouth at bedtime as needed.    Dispense:  30 tablet    Refill:  2    Orders Placed This Encounter  Procedures   US THYROID   Ceruloplasmin   EKG 12-Lead     Follow-up: Return in about 6 weeks (around 02/05/2024) for chronic follow up.   I,Marla I Leal-Borjas,acting as a scribe for Blane Ohara, MD.,have documented all relevant documentation on the behalf of Blane Ohara,  MD,as directed by  Blane Ohara, MD while in the presence of Blane Ohara, MD.   An After Visit Summary was printed and given to the patient.  I attest that I have reviewed this visit and agree with the plan scribed by my staff.   Blane Ohara, MD [facility] [phone]

## 2023-12-25 ENCOUNTER — Other Ambulatory Visit: Payer: Self-pay | Admitting: Family Medicine

## 2023-12-25 ENCOUNTER — Ambulatory Visit (INDEPENDENT_AMBULATORY_CARE_PROVIDER_SITE_OTHER): Payer: BC Managed Care – PPO | Admitting: Family Medicine

## 2023-12-25 ENCOUNTER — Encounter: Payer: Self-pay | Admitting: Family Medicine

## 2023-12-25 VITALS — BP 108/72 | HR 95 | Temp 98.5°F | Ht 66.0 in | Wt 128.0 lb

## 2023-12-25 DIAGNOSIS — R899 Unspecified abnormal finding in specimens from other organs, systems and tissues: Secondary | ICD-10-CM | POA: Diagnosis not present

## 2023-12-25 DIAGNOSIS — L659 Nonscarring hair loss, unspecified: Secondary | ICD-10-CM

## 2023-12-25 DIAGNOSIS — F411 Generalized anxiety disorder: Secondary | ICD-10-CM

## 2023-12-25 DIAGNOSIS — F330 Major depressive disorder, recurrent, mild: Secondary | ICD-10-CM

## 2023-12-25 DIAGNOSIS — F5101 Primary insomnia: Secondary | ICD-10-CM | POA: Diagnosis not present

## 2023-12-25 DIAGNOSIS — E0590 Thyrotoxicosis, unspecified without thyrotoxic crisis or storm: Secondary | ICD-10-CM

## 2023-12-25 DIAGNOSIS — R7982 Elevated C-reactive protein (CRP): Secondary | ICD-10-CM

## 2023-12-25 MED ORDER — BELSOMRA 10 MG PO TABS: 10.0000 mg | ORAL_TABLET | Freq: Every evening | ORAL | 2 refills | Status: DC | PRN

## 2023-12-25 MED ORDER — METHIMAZOLE 5 MG PO TABS: 5.0000 mg | ORAL_TABLET | Freq: Two times a day (BID) | ORAL | 1 refills | Status: DC

## 2023-12-25 NOTE — Patient Instructions (Addendum)
 Ordering thyroid ultrasound Start on methimazole 5 mg twice daily.  Start on belsomra 10 mg before bed.

## 2023-12-27 NOTE — Telephone Encounter (Signed)
 Left message for patient telling her to call us back and let us know if she is taking NP thyroid medication because the pharmacy has her taking the medication and was questioning why we are giving the Methamizole too as well. Told patient to call us back and let us know.

## 2023-12-27 NOTE — Telephone Encounter (Signed)
 Patient called back and states that she didn't realize that the NP thyroid was sent it but it was sent in from the original doctor she had seen that did the thyroid blood work that she brought in and discussed with you but she states that she never filled the medication and only wants to do what ever you recommend which is the Methamizole.

## 2023-12-28 ENCOUNTER — Inpatient Hospital Stay (HOSPITAL_BASED_OUTPATIENT_CLINIC_OR_DEPARTMENT_OTHER): Admission: RE | Admit: 2023-12-28 | Source: Ambulatory Visit | Admitting: Radiology

## 2023-12-28 ENCOUNTER — Ambulatory Visit (HOSPITAL_BASED_OUTPATIENT_CLINIC_OR_DEPARTMENT_OTHER)
Admission: RE | Admit: 2023-12-28 | Discharge: 2023-12-28 | Disposition: A | Discharge status: Home / Self Care | Source: Ambulatory Visit | Attending: Family Medicine | Admitting: Family Medicine

## 2023-12-28 DIAGNOSIS — E0590 Thyrotoxicosis, unspecified without thyrotoxic crisis or storm: Secondary | ICD-10-CM | POA: Diagnosis not present

## 2023-12-29 DIAGNOSIS — E0590 Thyrotoxicosis, unspecified without thyrotoxic crisis or storm: Secondary | ICD-10-CM | POA: Insufficient documentation

## 2023-12-29 DIAGNOSIS — R7982 Elevated C-reactive protein (CRP): Secondary | ICD-10-CM | POA: Insufficient documentation

## 2023-12-29 DIAGNOSIS — L659 Nonscarring hair loss, unspecified: Secondary | ICD-10-CM | POA: Insufficient documentation

## 2023-12-29 DIAGNOSIS — R899 Unspecified abnormal finding in specimens from other organs, systems and tissues: Secondary | ICD-10-CM | POA: Insufficient documentation

## 2023-12-29 NOTE — Assessment & Plan Note (Signed)
 Start on belsomra 10 mg before bed.

## 2023-12-29 NOTE — Assessment & Plan Note (Signed)
 Check ceruloplasmin.

## 2023-12-29 NOTE — Assessment & Plan Note (Signed)
 Start on methimazole 5 mg twice daily.

## 2023-12-29 NOTE — Assessment & Plan Note (Signed)
 Improving. May be stress related.  Treat hyperthyroidism.

## 2023-12-29 NOTE — Assessment & Plan Note (Signed)
 Check EKG: NSR no st changes.

## 2023-12-30 ENCOUNTER — Encounter: Payer: Self-pay | Admitting: Family Medicine

## 2023-12-31 NOTE — Telephone Encounter (Signed)
 Spoke with pharmacy advised they d/c NP Thyroid tat was rx'd by Dr. Danie Binder and to start methimazole per Dr. Sedalia Muta. Informed patient if this who verbalized understanding, patient already scheduled for 6 week follow.

## 2024-01-07 ENCOUNTER — Other Ambulatory Visit: Payer: Self-pay | Admitting: Family Medicine

## 2024-01-08 ENCOUNTER — Ambulatory Visit: Payer: BC Managed Care – PPO | Admitting: Family Medicine

## 2024-01-08 ENCOUNTER — Other Ambulatory Visit: Payer: Self-pay

## 2024-01-08 MED ORDER — DIAZEPAM 5 MG PO TABS: 5.0000 mg | ORAL_TABLET | Freq: Two times a day (BID) | ORAL | 0 refills | Status: DC | PRN

## 2024-01-19 DIAGNOSIS — Z419 Encounter for procedure for purposes other than remedying health state, unspecified: Secondary | ICD-10-CM | POA: Diagnosis not present

## 2024-02-04 NOTE — Progress Notes (Signed)
 Subjective:  Patient ID: Rebecca Wallace, female    DOB: [birth year]  Age: 38 y.o. MRN: [id number]  Chief Complaint  Patient presents with   Medical Management of Chronic Issues    HPI:  Patient presents for chronic follow up.  Hyperthyroidism: taking methimazole  5 mg BID. 9 months postpartum. Her obstetrician would like this monitored closely.   Insomnia: Belsomra  10 mg helps but it is too expensive.     12/25/2023    1:21 PM 07/10/2023    2:58 PM 08/08/2022    3:30 PM 02/02/2022    3:45 PM 07/05/2021    2:18 PM  Depression screen PHQ 2/9  Decreased Interest 0 0 0 0 0  Down, Depressed, Hopeless 0 0 0 0 0  PHQ - 2 Score 0 0 0 0 0  Altered sleeping 3 0 0 2 3  Tired, decreased energy 3 0 0 0 0  Change in appetite 0 0 0 0 0  Feeling bad or failure about yourself  0 0 0 0 0  Trouble concentrating 0 0 0 0 0  Moving slowly or fidgety/restless 0 0 0 0 0  Suicidal thoughts 0 0 0 0 0  PHQ-9 Score 6 0 0 2 3  Difficult doing work/chores Somewhat difficult Not difficult at all Not difficult at all  Not difficult at all        07/10/2023    2:58 PM  Fall Risk   Falls in the past year? 0  Number falls in past yr: 0  Injury with Fall? 0  Risk for fall due to : No Fall Risks  Follow up Falls evaluation completed;Falls prevention discussed    Patient Care Team: Gildo Crisco, Burleigh Carp, MD as PCP - General (Family Medicine) Alonzo Artis, DO (Obstetrics and Gynecology)   Review of Systems  Constitutional:  Negative for chills, fatigue and fever.  HENT:  Negative for congestion, ear pain, rhinorrhea and sore throat.   Respiratory:  Negative for cough and shortness of breath.   Cardiovascular:  Negative for chest pain.  Gastrointestinal:  Negative for abdominal pain, constipation, diarrhea, nausea and vomiting.  Genitourinary:  Negative for dysuria and urgency.  Musculoskeletal:  Positive for back pain. Negative for myalgias.  Neurological:  Negative for dizziness, weakness,  light-headedness and headaches.  Psychiatric/Behavioral:  Negative for dysphoric mood. The patient is not nervous/anxious.     Current Outpatient Medications on File Prior to Visit  Medication Sig Dispense Refill   ipratropium (ATROVENT ) 0.06 % nasal spray Place 2 sprays into both nostrils 3 (three) times daily. 15 mL 2   methimazole  (TAPAZOLE ) 5 MG tablet TAKE 1 TABLET BY MOUTH TWICE DAILY 60 tablet 1   Multiple Vitamin (MULTIVITAMIN) tablet Take 1 tablet by mouth daily.     promethazine  (PHENERGAN ) 25 MG tablet TAKE 1 TABLET BY MOUTH EVERY 6 HOURS AS NEEDED FOR NAUSEA AND VOMITING 30 tablet 0   No current facility-administered medications on file prior to visit.   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)    GERD (gastroesophageal reflux disease)    History of cervical cancer    Idiopathic hypotension    Insomnia due to other mental disorder    Lyme disease    Major depressive disorder, recurrent, moderate (HCC)    Migraine without aura, not intractable, without status migrainosus    Other idiopathic scoliosis, lumbar region    Premenstrual tension syndrome  Small intestinal bacterial overgrowth    Xerosis cutis    Past Surgical History:  Procedure Laterality Date   ANAL FISSURE REPAIR  01/11/2019   BREAST ENHANCEMENT SURGERY  08/18/2009   CESAREAN SECTION  03/29/2023   CHOLECYSTECTOMY  2018   GANGLION CYST EXCISION     Left hand 2005   KNEE SURGERY Left 12/2017   SINOSCOPY  09/20/2011   TONSILLECTOMY  10/2007    Family History  Problem Relation Age of Onset   Diabetes Mother    Sjogren's syndrome Mother    High blood pressure Mother    Anxiety disorder Mother    Depression Mother    Diabetes Father    High blood pressure Maternal Grandmother    Heart Problems Maternal Grandmother    Heart attack Maternal Grandfather    COPD Other    Social History   Socioeconomic History   Marital status: Single     Spouse name: Not on file   Number of children: Not on file   Years of education: Not on file   Highest education level: Not on file  Occupational History   Occupation: book Biomedical engineer  Tobacco Use   Smoking status: Former    Current packs/day: 0.00    Average packs/day: 1 pack/day for 10.0 years (10.0 ttl pk-yrs)    Types: Cigarettes    Start date: 2006    Quit date: 2016    Years since quitting: 9.3   Smokeless tobacco: Never  Vaping Use   Vaping status: Every Day   Devices: E-cig  Substance and Sexual Activity   Alcohol use: Yes    Comment: Seldom   Drug use: Never   Sexual activity: Not Currently  Other Topics Concern   Not on file  Social History Narrative   Not on file   Social Drivers of Health   Financial Resource Strain: Low Risk  (12/25/2023)   Overall Financial Resource Strain (CARDIA)    Difficulty of Paying Living Expenses: Not hard at all  Food Insecurity: No Food Insecurity (12/25/2023)   Hunger Vital Sign    Worried About Running Out of Food in the Last Year: Never true    Ran Out of Food in the Last Year: Never true  Transportation Needs: No Transportation Needs (12/25/2023)   PRAPARE - Administrator, Civil Service (Medical): No    Lack of Transportation (Non-Medical): No  Physical Activity: Sufficiently Active (12/25/2023)   Exercise Vital Sign    Days of Exercise per Week: 7 days    Minutes of Exercise per Session: 30 min  Stress: No Stress Concern Present (12/25/2023)   [employer] of Occupational Health - Occupational Stress Questionnaire    Feeling of Stress : Not at all  Social Connections: Moderately Isolated (12/25/2023)   Social Connection and Isolation Panel [NHANES]    Frequency of Communication with Friends and Family: Three times a week    Frequency of Social Gatherings with Friends and Family: Three times a week    Attends Religious Services: Never    Active Member of Clubs or Organizations: No    Attends Tax inspector Meetings: Never    Marital Status: Married    Objective:  BP 108/78   Pulse 88   Temp 98.1 F (36.7 C)   Ht 5\' 6"  (1.676 m)   Wt 127 lb (57.6 kg)   SpO2 98%   BMI 20.50 kg/m      02/05/2024    2:43 PM  12/25/2023    1:20 PM 09/17/2023    9:38 AM  BP/Weight  Systolic BP 108 108 100  Diastolic BP 78 72 62  Wt. (Lbs) 127 128 127.8  BMI 20.5 kg/m2 20.66 kg/m2 20.63 kg/m2    Physical Exam Vitals reviewed.  Constitutional:      Appearance: Normal appearance. She is normal weight.  Neck:     Vascular: No carotid bruit.  Cardiovascular:     Rate and Rhythm: Normal rate and regular rhythm.     Heart sounds: Normal heart sounds.  Pulmonary:     Effort: Pulmonary effort is normal. No respiratory distress.     Breath sounds: Normal breath sounds.  Abdominal:     General: Abdomen is flat. Bowel sounds are normal.     Palpations: Abdomen is soft.     Tenderness: There is no abdominal tenderness.  Neurological:     Mental Status: She is alert and oriented to person, place, and time.  Psychiatric:        Mood and Affect: Mood normal.        Behavior: Behavior normal.     Diabetic Foot Exam - Simple   No data filed      Lab Results  Component Value Date   WBC 6.1 02/04/2021   HGB 14.2 02/04/2021   HCT 42.9 02/04/2021   PLT 270 02/04/2021   GLUCOSE 83 02/04/2021   CHOL 156 02/04/2021   TRIG 93 02/04/2021   HDL 84 02/04/2021   LDLCALC 55 02/04/2021   ALT 15 02/04/2021   AST 21 02/04/2021   NA 134 02/04/2021   K 4.7 02/04/2021   CL 100 02/04/2021   CREATININE 0.86 02/04/2021   BUN 14 02/04/2021   CO2 23 02/04/2021   TSH 0.610 02/05/2024      Assessment & Plan:  Subclinical hyperthyroidism Assessment & Plan: Previously well controlled Continue Synthroid at current dose  Recheck TSH and adjust Synthroid as indicated    Orders: -     T4, free -     TSH  Primary insomnia Assessment & Plan: Sent a refill of Belsomra  10 mg daily, then  increase to 15 mg at bedtime. Given voucher to get 10 days free and recommended patient go on line to get the 30 day coupon. Let us  know if this is not working and we will order the lunesta .  Orders: -     Belsomra ; Take 1 tablet (10 mg total) by mouth at bedtime as needed.  Dispense: 10 tablet; Refill: 0 -     Belsomra ; Take 1 tablet (15 mg total) by mouth at bedtime as needed.  Dispense: 10 tablet; Refill: 0  Other fatigue Assessment & Plan: Check labs  Orders: -     Ceruloplasmin  Muscle spasms of neck -     diazePAM ; Take 1 tablet (5 mg total) by mouth every 12 (twelve) hours as needed for muscle spasms.  Dispense: 15 tablet; Refill: 1     Meds ordered this encounter  Medications   diazepam  (VALIUM ) 5 MG tablet    Sig: Take 1 tablet (5 mg total) by mouth every 12 (twelve) hours as needed for muscle spasms.    Dispense:  15 tablet    Refill:  1   Suvorexant  (BELSOMRA ) 10 MG TABS    Sig: Take 1 tablet (10 mg total) by mouth at bedtime as needed.    Dispense:  10 tablet    Refill:  0   Suvorexant  (BELSOMRA ) 15  MG TABS    Sig: Take 1 tablet (15 mg total) by mouth at bedtime as needed.    Dispense:  10 tablet    Refill:  0    Orders Placed This Encounter  Procedures   T4, free   TSH   Ceruloplasmin     Follow-up: No follow-ups on file.   I,Marla I Leal-Borjas,acting as a scribe for Mercy Stall, MD.,have documented all relevant documentation on the behalf of Mercy Stall, MD,as directed by  Mercy Stall, MD while in the presence of Mercy Stall, MD.   An After Visit Summary was printed and given to the patient.  I attest that I have reviewed this visit and agree with the plan scribed by my staff.   Mercy Stall, MD [facility] [phone]

## 2024-02-05 ENCOUNTER — Encounter: Payer: Self-pay | Admitting: Family Medicine

## 2024-02-05 ENCOUNTER — Ambulatory Visit (INDEPENDENT_AMBULATORY_CARE_PROVIDER_SITE_OTHER): Admitting: Family Medicine

## 2024-02-05 VITALS — BP 108/78 | HR 88 | Temp 98.1°F | Ht 66.0 in | Wt 127.0 lb

## 2024-02-05 DIAGNOSIS — E0590 Thyrotoxicosis, unspecified without thyrotoxic crisis or storm: Secondary | ICD-10-CM | POA: Diagnosis not present

## 2024-02-05 DIAGNOSIS — F5101 Primary insomnia: Secondary | ICD-10-CM

## 2024-02-05 DIAGNOSIS — M62838 Other muscle spasm: Secondary | ICD-10-CM | POA: Diagnosis not present

## 2024-02-05 DIAGNOSIS — R5383 Other fatigue: Secondary | ICD-10-CM | POA: Diagnosis not present

## 2024-02-05 MED ORDER — DIAZEPAM 5 MG PO TABS: 5.0000 mg | ORAL_TABLET | Freq: Two times a day (BID) | ORAL | 1 refills | Status: DC | PRN

## 2024-02-05 MED ORDER — BELSOMRA 10 MG PO TABS
10.0000 mg | ORAL_TABLET | Freq: Every evening | ORAL | 0 refills | Status: DC | PRN
Start: 2024-02-05 — End: 2024-06-02

## 2024-02-05 MED ORDER — BELSOMRA 15 MG PO TABS: 15.0000 mg | ORAL_TABLET | Freq: Every evening | ORAL | 0 refills | Status: DC | PRN

## 2024-02-06 ENCOUNTER — Encounter: Payer: Self-pay | Admitting: Family Medicine

## 2024-02-06 LAB — CERULOPLASMIN: Ceruloplasmin: 19.8 mg/dL (ref 19.0–39.0)

## 2024-02-06 LAB — TSH: TSH: 0.61 u[IU]/mL (ref 0.450–4.500)

## 2024-02-06 LAB — T4, FREE: Free T4: 1.4 ng/dL (ref 0.82–1.77)

## 2024-02-07 NOTE — Assessment & Plan Note (Signed)
 Previously well controlled Continue Synthroid at current dose  Recheck TSH and adjust Synthroid as indicated

## 2024-02-07 NOTE — Assessment & Plan Note (Addendum)
 Sent a refill of Belsomra  10 mg daily, then increase to 15 mg at bedtime. Given voucher to get 10 days free and recommended patient go on line to get the 30 day coupon. Let us  know if this is not working and we will order the lunesta .

## 2024-02-07 NOTE — Assessment & Plan Note (Signed)
 Check labs

## 2024-02-18 DIAGNOSIS — Z419 Encounter for procedure for purposes other than remedying health state, unspecified: Secondary | ICD-10-CM | POA: Diagnosis not present

## 2024-03-05 ENCOUNTER — Encounter: Payer: Self-pay | Admitting: Family Medicine

## 2024-03-17 ENCOUNTER — Other Ambulatory Visit: Payer: Self-pay

## 2024-03-17 DIAGNOSIS — E0590 Thyrotoxicosis, unspecified without thyrotoxic crisis or storm: Secondary | ICD-10-CM

## 2024-03-19 ENCOUNTER — Other Ambulatory Visit

## 2024-03-20 ENCOUNTER — Ambulatory Visit: Admitting: Family Medicine

## 2024-04-01 ENCOUNTER — Other Ambulatory Visit: Payer: Self-pay | Admitting: Family Medicine

## 2024-04-01 ENCOUNTER — Encounter: Payer: Self-pay | Admitting: Family Medicine

## 2024-04-01 MED ORDER — ESZOPICLONE 3 MG PO TABS: 3.0000 mg | ORAL_TABLET | Freq: Every day | ORAL | 3 refills | Status: DC

## 2024-04-02 ENCOUNTER — Other Ambulatory Visit: Payer: Self-pay

## 2024-04-02 DIAGNOSIS — E0590 Thyrotoxicosis, unspecified without thyrotoxic crisis or storm: Secondary | ICD-10-CM

## 2024-04-04 ENCOUNTER — Other Ambulatory Visit

## 2024-04-04 DIAGNOSIS — E0590 Thyrotoxicosis, unspecified without thyrotoxic crisis or storm: Secondary | ICD-10-CM

## 2024-04-07 ENCOUNTER — Ambulatory Visit: Payer: Self-pay | Admitting: Family Medicine

## 2024-04-07 LAB — TSH: TSH: 0.745 u[IU]/mL (ref 0.450–4.500)

## 2024-04-14 ENCOUNTER — Other Ambulatory Visit: Payer: Self-pay | Admitting: Family Medicine

## 2024-04-14 DIAGNOSIS — M62838 Other muscle spasm: Secondary | ICD-10-CM

## 2024-05-18 ENCOUNTER — Other Ambulatory Visit: Payer: Self-pay | Admitting: Family Medicine

## 2024-05-18 DIAGNOSIS — M62838 Other muscle spasm: Secondary | ICD-10-CM

## 2024-05-26 ENCOUNTER — Telehealth: Payer: Self-pay

## 2024-05-26 NOTE — Telephone Encounter (Signed)
 Patient made aware PA was approved and she should be able to pick up medication.  Copied from CRM #[id number]. Topic: Clinical - Medication Prior Auth >> May 26, 2024 11:21 AM DeAngela L wrote: Reason for CRM: Patient calling to check the status of Prior Auth for Eszopiclone  3 MG TABS, she spoke with her insurance provider and they suggested that she call the office for an update since it has been 13 days   Pt num [phone] (M)

## 2024-06-01 NOTE — Progress Notes (Signed)
 Subjective:  Patient ID: Rebecca Wallace, female    DOB: Feb 13, 1986  Age: 38 y.o. MRN: [id number]  No chief complaint on file.   HPI: Discussed the use of AI scribe software for clinical note transcription with the patient, who gave verbal consent to proceed.  History of Present Illness   Hyperthyroidism: taking methimazole  5 mg BID. 9 months postpartum. Her obstetrician would like this monitored closely.   Insomnia: Belsomra  10 mg helps but it is too expensive.       12/25/2023    1:21 PM 07/10/2023    2:58 PM 08/08/2022    3:30 PM 02/02/2022    3:45 PM 07/05/2021    2:18 PM  Depression screen PHQ 2/9  Decreased Interest 0 0 0 0 0  Down, Depressed, Hopeless 0 0 0 0 0  PHQ - 2 Score 0 0 0 0 0  Altered sleeping 3 0 0 2 3  Tired, decreased energy 3 0 0 0 0  Change in appetite 0 0 0 0 0  Feeling bad or failure about yourself  0 0 0 0 0  Trouble concentrating 0 0 0 0 0  Moving slowly or fidgety/restless 0 0 0 0 0  Suicidal thoughts 0 0 0 0 0  PHQ-9 Score 6 0 0 2 3  Difficult doing work/chores Somewhat difficult Not difficult at all Not difficult at all  Not difficult at all        07/10/2023    2:58 PM  Fall Risk   Falls in the past year? 0  Number falls in past yr: 0  Injury with Fall? 0  Risk for fall due to : No Fall Risks  Follow up Falls evaluation completed;Falls prevention discussed    Patient Care Team: Alizey Noren, Abigail, MD as PCP - General (Family Medicine) Charissa Slough, DO (Obstetrics and Gynecology)   Review of Systems  Constitutional:  Negative for chills, fatigue and fever.  HENT:  Negative for congestion, ear pain and sore throat.   Respiratory:  Negative for cough and shortness of breath.   Cardiovascular:  Negative for chest pain.  Gastrointestinal:  Negative for abdominal pain, constipation, diarrhea, nausea and vomiting.  Genitourinary:  Negative for dysuria and urgency.  Musculoskeletal:  Negative for arthralgias and myalgias.  Skin:  Negative  for rash.  Neurological:  Negative for dizziness and headaches.  Psychiatric/Behavioral:  Negative for dysphoric mood. The patient is not nervous/anxious.     Current Outpatient Medications on File Prior to Visit  Medication Sig Dispense Refill   diazepam  (VALIUM ) 5 MG tablet TAKE 1 TABLET BY MOUTH EVERY 12 HOURS AS NEEDED FOR MUSCLE SPASM 15 tablet 0   Eszopiclone  3 MG TABS Take 1 tablet (3 mg total) by mouth at bedtime. Take immediately before bedtime 30 tablet 3   ipratropium (ATROVENT ) 0.06 % nasal spray Place 2 sprays into both nostrils 3 (three) times daily. 15 mL 2   methimazole  (TAPAZOLE ) 5 MG tablet TAKE 1 TABLET BY MOUTH TWICE DAILY 60 tablet 1   Multiple Vitamin (MULTIVITAMIN) tablet Take 1 tablet by mouth daily.     promethazine  (PHENERGAN ) 25 MG tablet TAKE 1 TABLET BY MOUTH EVERY 6 HOURS AS NEEDED FOR NAUSEA AND VOMITING 30 tablet 0   Suvorexant  (BELSOMRA ) 10 MG TABS Take 1 tablet (10 mg total) by mouth at bedtime as needed. 10 tablet 0   Suvorexant  (BELSOMRA ) 15 MG TABS Take 1 tablet (15 mg total) by mouth at bedtime as needed. 10 tablet 0  No current facility-administered medications on file prior to visit.   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)    GERD (gastroesophageal reflux disease)    History of cervical cancer    Idiopathic hypotension    Insomnia due to other mental disorder    Lyme disease    Major depressive disorder, recurrent, moderate (HCC)    Migraine without aura, not intractable, without status migrainosus    Other idiopathic scoliosis, lumbar region    Premenstrual tension syndrome    Small intestinal bacterial overgrowth    Xerosis cutis    Past Surgical History:  Procedure Laterality Date   ANAL FISSURE REPAIR  01/11/2019   BREAST ENHANCEMENT SURGERY  08/18/2009   CESAREAN SECTION  03/29/2023   CHOLECYSTECTOMY  2018   GANGLION CYST EXCISION     Left hand 2005   KNEE SURGERY  Left 12/2017   SINOSCOPY  09/20/2011   TONSILLECTOMY  10/2007    Family History  Problem Relation Age of Onset   Diabetes Mother    Sjogren's syndrome Mother    High blood pressure Mother    Anxiety disorder Mother    Depression Mother    Diabetes Father    High blood pressure Maternal Grandmother    Heart Problems Maternal Grandmother    Heart attack Maternal Grandfather    COPD Other    Social History   Socioeconomic History   Marital status: Single    Spouse name: Not on file   Number of children: Not on file   Years of education: Not on file   Highest education level: Not on file  Occupational History   Occupation: book Biomedical engineer  Tobacco Use   Smoking status: Former    Current packs/day: 0.00    Average packs/day: 1 pack/day for 10.0 years (10.0 ttl pk-yrs)    Types: Cigarettes    Start date: 2006    Quit date: 2016    Years since quitting: 9.6   Smokeless tobacco: Never  Vaping Use   Vaping status: Every Day   Devices: E-cig  Substance and Sexual Activity   Alcohol use: Yes    Comment: Seldom   Drug use: Never   Sexual activity: Not Currently  Other Topics Concern   Not on file  Social History Narrative   Not on file   Social Drivers of Health   Financial Resource Strain: Low Risk  (12/25/2023)   Overall Financial Resource Strain (CARDIA)    Difficulty of Paying Living Expenses: Not hard at all  Food Insecurity: No Food Insecurity (12/25/2023)   Hunger Vital Sign    Worried About Running Out of Food in the Last Year: Never true    Ran Out of Food in the Last Year: Never true  Transportation Needs: No Transportation Needs (12/25/2023)   PRAPARE - Administrator, Civil Service (Medical): No    Lack of Transportation (Non-Medical): No  Physical Activity: Sufficiently Active (12/25/2023)   Exercise Vital Sign    Days of Exercise per Week: 7 days    Minutes of Exercise per Session: 30 min  Stress: No Stress Concern Present (12/25/2023)   [employer] of Occupational Health - Occupational Stress Questionnaire    Feeling of Stress : Not at all  Social Connections: Moderately Isolated (12/25/2023)   Social Connection and Isolation Panel    Frequency of Communication with Friends and Family: Three times  a week    Frequency of Social Gatherings with Friends and Family: Three times a week    Attends Religious Services: Never    Active Member of Clubs or Organizations: No    Attends Banker Meetings: Never    Marital Status: Married    Objective:  There were no vitals taken for this visit.     02/05/2024    2:43 PM 12/25/2023    1:20 PM 09/17/2023    9:38 AM  BP/Weight  Systolic BP 108 108 100  Diastolic BP 78 72 62  Wt. (Lbs) 127 128 127.8  BMI 20.5 kg/m2 20.66 kg/m2 20.63 kg/m2    Physical Exam Vitals reviewed.  Constitutional:      Appearance: Normal appearance. She is normal weight.  Neck:     Vascular: No carotid bruit.  Cardiovascular:     Rate and Rhythm: Normal rate and regular rhythm.     Heart sounds: Normal heart sounds.  Pulmonary:     Effort: Pulmonary effort is normal. No respiratory distress.     Breath sounds: Normal breath sounds.  Abdominal:     General: Abdomen is flat. Bowel sounds are normal.     Palpations: Abdomen is soft.     Tenderness: There is no abdominal tenderness.  Neurological:     Mental Status: She is alert and oriented to person, place, and time.  Psychiatric:        Mood and Affect: Mood normal.        Behavior: Behavior normal.     {Perform Simple Foot Exam  Perform Detailed exam:1} {Insert foot Exam (Optional):30965}   Lab Results  Component Value Date   WBC 6.1 02/04/2021   HGB 14.2 02/04/2021   HCT 42.9 02/04/2021   PLT 270 02/04/2021   GLUCOSE 83 02/04/2021   CHOL 156 02/04/2021   TRIG 93 02/04/2021   HDL 84 02/04/2021   LDLCALC 55 02/04/2021   ALT 15 02/04/2021   AST 21 02/04/2021   NA 134 02/04/2021   K 4.7 02/04/2021   CL 100 02/04/2021    CREATININE 0.86 02/04/2021   BUN 14 02/04/2021   CO2 23 02/04/2021   TSH 0.745 04/04/2024      Assessment & Plan:  There are no diagnoses linked to this encounter.   Assessment and Plan Assessment & Plan      No orders of the defined types were placed in this encounter.   No orders of the defined types were placed in this encounter.    Follow-up: No follow-ups on file.   I,Theta Leaf,acting as a Neurosurgeon for Abigail Free, MD.,have documented all relevant documentation on the behalf of Abigail Free, MD,as directed by  Abigail Free, MD while in the presence of Abigail Free, MD.   An After Visit Summary was printed and given to the patient.  Abigail Free, MD [facility] [phone]

## 2024-06-02 ENCOUNTER — Ambulatory Visit: Admitting: Family Medicine

## 2024-06-02 ENCOUNTER — Encounter: Payer: Self-pay | Admitting: Family Medicine

## 2024-06-02 VITALS — BP 130/70 | HR 97 | Temp 98.4°F | Ht 66.0 in | Wt 123.0 lb

## 2024-06-02 DIAGNOSIS — L650 Telogen effluvium: Secondary | ICD-10-CM | POA: Diagnosis not present

## 2024-06-02 DIAGNOSIS — L659 Nonscarring hair loss, unspecified: Secondary | ICD-10-CM | POA: Diagnosis not present

## 2024-06-02 DIAGNOSIS — R110 Nausea: Secondary | ICD-10-CM | POA: Insufficient documentation

## 2024-06-02 DIAGNOSIS — E0590 Thyrotoxicosis, unspecified without thyrotoxic crisis or storm: Secondary | ICD-10-CM | POA: Insufficient documentation

## 2024-06-02 DIAGNOSIS — F411 Generalized anxiety disorder: Secondary | ICD-10-CM

## 2024-06-02 MED ORDER — PROMETHAZINE HCL 25 MG PO TABS: ORAL_TABLET | ORAL | 3 refills | Status: AC

## 2024-06-02 MED ORDER — VENLAFAXINE HCL ER 37.5 MG PO CP24: 37.5000 mg | ORAL_CAPSULE | Freq: Every day | ORAL | 0 refills | Status: DC

## 2024-06-02 MED ORDER — FINASTERIDE 5 MG PO TABS: 5.0000 mg | ORAL_TABLET | Freq: Every day | ORAL | 2 refills | Status: DC

## 2024-06-02 NOTE — Patient Instructions (Signed)
  VISIT SUMMARY: During your visit, we discussed your ongoing nausea, hair loss, sleep disturbance, anxiety, and muscle spasms. We have made some adjustments to your medications and ordered tests to better understand your symptoms.  YOUR PLAN: NAUSEA: You have been experiencing increased nausea over the past couple of months, typically after eating, which may be related to small intestinal bacterial overgrowth (SIBO). -Continue following the SIBO diet. -Take promethazine  as needed for nausea.  ANXIETY: You have intermittent anxiety  -Start taking venlafaxine  37.5 mg daily in the morning for long-term anxiety management. -Take half a diazepam  tablet as needed for acute anxiety.  SLEEP DISTURBANCE: You have been taking Lunesta  for sleep, which has been effective without causing morning drowsiness. -Continue taking Lunesta  as prescribed.  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 -We will order blood tests, including a ferritin level, to check for nutritional deficiencies. -Consider starting finasteride  5 mg once daily for hair loss management.                      Contains text generated by [employer].                                 Contains text generated by [employer].

## 2024-06-02 NOTE — Assessment & Plan Note (Signed)
 Intermittent anxiety with severe episodes. Venlafaxine  effective for long-term management. Diazepam  discussed for acute anxiety and muscle spasms. - Prescribe venlafaxine  37.5 mg daily in the morning. - Advise half a diazepam  tablet as needed for acute anxiety.

## 2024-06-02 NOTE — Assessment & Plan Note (Signed)
 Phenergan  prescription given.

## 2024-06-02 NOTE — Assessment & Plan Note (Signed)
 Order labs.  Start on finasteride  5 mg daily

## 2024-06-02 NOTE — Assessment & Plan Note (Signed)
 Resolved with steroid injections done by dermatology.

## 2024-06-02 NOTE — Assessment & Plan Note (Signed)
 Check levels

## 2024-06-03 ENCOUNTER — Ambulatory Visit: Payer: Self-pay | Admitting: Family Medicine

## 2024-06-03 LAB — CBC WITH DIFFERENTIAL/PLATELET
Basophils Absolute: 0 x10E3/uL (ref 0.0–0.2)
Basos: 0 %
EOS (ABSOLUTE): 0.1 x10E3/uL (ref 0.0–0.4)
Eos: 1 %
Hematocrit: 44.4 % (ref 34.0–46.6)
Hemoglobin: 14.3 g/dL (ref 11.1–15.9)
Immature Grans (Abs): 0 x10E3/uL (ref 0.0–0.1)
Immature Granulocytes: 0 %
Lymphocytes Absolute: 2.7 x10E3/uL (ref 0.7–3.1)
Lymphs: 28 %
MCH: 31.6 pg (ref 26.6–33.0)
MCHC: 32.2 g/dL (ref 31.5–35.7)
MCV: 98 fL — ABNORMAL HIGH (ref 79–97)
Monocytes Absolute: 0.7 x10E3/uL (ref 0.1–0.9)
Monocytes: 7 %
Neutrophils Absolute: 6.2 x10E3/uL (ref 1.4–7.0)
Neutrophils: 64 %
Platelets: 292 x10E3/uL (ref 150–450)
RBC: 4.53 x10E6/uL (ref 3.77–5.28)
RDW: 12.3 % (ref 11.7–15.4)
WBC: 9.8 x10E3/uL (ref 3.4–10.8)

## 2024-06-03 LAB — COMPREHENSIVE METABOLIC PANEL WITH GFR
ALT: 18 IU/L (ref 0–32)
AST: 20 IU/L (ref 0–40)
Albumin: 4.7 g/dL (ref 3.9–4.9)
Alkaline Phosphatase: 65 IU/L (ref 44–121)
BUN/Creatinine Ratio: 22 (ref 9–23)
BUN: 17 mg/dL (ref 6–20)
Bilirubin Total: 0.3 mg/dL (ref 0.0–1.2)
CO2: 21 mmol/L (ref 20–29)
Calcium: 9.7 mg/dL (ref 8.7–10.2)
Chloride: 101 mmol/L (ref 96–106)
Creatinine, Ser: 0.77 mg/dL (ref 0.57–1.00)
Globulin, Total: 2.3 g/dL (ref 1.5–4.5)
Glucose: 85 mg/dL (ref 70–99)
Potassium: 4.5 mmol/L (ref 3.5–5.2)
Sodium: 136 mmol/L (ref 134–144)
Total Protein: 7 g/dL (ref 6.0–8.5)
eGFR: 102 mL/min/1.73 (ref >59)

## 2024-06-03 LAB — TSH: TSH: 0.792 u[IU]/mL (ref 0.450–4.500)

## 2024-06-03 LAB — T4, FREE: Free T4: 1.36 ng/dL (ref 0.82–1.77)

## 2024-06-03 LAB — FERRITIN: Ferritin: 32 ng/mL (ref 15–150)

## 2024-06-11 ENCOUNTER — Encounter: Payer: Self-pay | Admitting: Family Medicine

## 2024-06-11 ENCOUNTER — Other Ambulatory Visit: Payer: Self-pay | Admitting: Family Medicine

## 2024-06-11 MED ORDER — BUSPIRONE HCL 7.5 MG PO TABS: 7.5000 mg | ORAL_TABLET | Freq: Two times a day (BID) | ORAL | 2 refills | Status: DC

## 2024-06-20 ENCOUNTER — Other Ambulatory Visit: Payer: Self-pay | Admitting: Family Medicine

## 2024-06-20 DIAGNOSIS — M62838 Other muscle spasm: Secondary | ICD-10-CM

## 2024-07-22 ENCOUNTER — Other Ambulatory Visit: Payer: Self-pay | Admitting: Family Medicine

## 2024-07-22 DIAGNOSIS — M62838 Other muscle spasm: Secondary | ICD-10-CM

## 2024-07-28 ENCOUNTER — Other Ambulatory Visit: Payer: Self-pay | Admitting: Family Medicine

## 2024-07-28 MED ORDER — ESZOPICLONE 3 MG PO TABS: 3.0000 mg | ORAL_TABLET | Freq: Every day | ORAL | 5 refills | Status: AC

## 2024-07-28 NOTE — Telephone Encounter (Signed)
 Copied from CRM #[id number]. Topic: Clinical - Medication Refill >> Jul 28, 2024  3:53 PM Montie POUR wrote: Medication:  Eszopiclone  3 MG TABS   Has the patient contacted their pharmacy? Yes (Agent: If no, request that the patient contact the pharmacy for the refill. If patient does not wish to contact the pharmacy document the reason why and proceed with request.) (Agent: If yes, when and what did the pharmacy advise?) Pharmacy needs order to refill  This is the patient's preferred pharmacy:  [facility] [address] [place] - [address] [place] [address] Phone: [phone] Fax: [phone]  Is this the correct pharmacy for this prescription? Yes If no, delete pharmacy and type the correct one.   Has the prescription been filled recently? No  Is the patient out of the medication? Yes She has been out of medication since last Thursday  Has the patient been seen for an appointment in the last year OR does the patient have an upcoming appointment? Yes  Can we respond through MyChart? Yes  Agent: Please be advised that Rx refills may take up to 3 business days. We ask that you follow-up with your pharmacy.

## 2024-08-12 NOTE — Telephone Encounter (Signed)
-----   Message from Sharlet Meier sent at 08/06/2024  2:44 PM EDT ----- Notify abnormal pap - needs colpo ----- Message ----- From: Lab, Background User Sent: 08/02/2024   5:39 PM EDT To: Sharlet Meier, MD

## 2024-08-13 ENCOUNTER — Encounter: Payer: Self-pay | Admitting: Family Medicine

## 2024-08-18 ENCOUNTER — Other Ambulatory Visit: Payer: Self-pay | Admitting: Family Medicine

## 2024-08-18 DIAGNOSIS — F411 Generalized anxiety disorder: Secondary | ICD-10-CM

## 2024-08-18 MED ORDER — VENLAFAXINE HCL ER 37.5 MG PO CP24: 37.5000 mg | ORAL_CAPSULE | Freq: Every day | ORAL | 0 refills | Status: AC

## 2024-08-25 ENCOUNTER — Other Ambulatory Visit: Payer: Self-pay | Admitting: Family Medicine

## 2024-08-25 DIAGNOSIS — M62838 Other muscle spasm: Secondary | ICD-10-CM

## 2024-09-02 NOTE — Progress Notes (Unsigned)
 Subjective:  Patient ID: Rebecca Wallace, female    DOB: May 25, 1986  Age: 38 y.o. MRN: [id number]  Chief Complaint  Patient presents with   Medical Management of Chronic Issues    HPI: Discussed the use of AI scribe software for clinical note transcription with the patient, who gave verbal consent to proceed.  History of Present Illness Rebecca Wallace is a 38 year old female who presents with sinus pain and pressure.  Sinus symptoms - Sinus headache, facial pressure, and tooth pain present - Associated with some nasal drainage - No fever, chills, or sweats  Musculoskeletal pain - Variable muscle pain, with some days better than others - Overall improvement compared to previous episodes - Maintains daily stretching and yoga regimen  Depression and anxiety: preparing to restart effexor  xr 37.5 mg once daily in am. Was waiting on it from the pharmacy.        12/25/2023    1:21 PM 07/10/2023    2:58 PM 08/08/2022    3:30 PM 02/02/2022    3:45 PM 07/05/2021    2:18 PM  Depression screen PHQ 2/9  Decreased Interest 0 0 0 0 0  Down, Depressed, Hopeless 0 0 0 0 0  PHQ - 2 Score 0 0 0 0 0  Altered sleeping 3 0 0 2 3  Tired, decreased energy 3 0 0 0 0  Change in appetite 0 0 0 0 0  Feeling bad or failure about yourself  0 0 0 0 0  Trouble concentrating 0 0 0 0 0  Moving slowly or fidgety/restless 0 0 0 0 0  Suicidal thoughts 0 0 0 0 0  PHQ-9 Score 6  0  0  2  3   Difficult doing work/chores Somewhat difficult Not difficult at all Not difficult at all  Not difficult at all     Data saved with a previous flowsheet row definition        07/10/2023    2:58 PM  Fall Risk   Falls in the past year? 0  Number falls in past yr: 0  Injury with Fall? 0  Risk for fall due to : No Fall Risks  Follow up Falls evaluation completed;Falls prevention discussed    Patient Care Team: Kewanda Poland, Abigail, MD as PCP - General (Family Medicine) Charissa Slough, DO (Obstetrics and Gynecology)    Review of Systems  Constitutional:  Negative for chills, fatigue and fever.  HENT:  Positive for congestion, ear pain, postnasal drip, sinus pressure and sinus pain. Negative for sore throat.   Respiratory:  Negative for cough and shortness of breath.   Cardiovascular:  Negative for chest pain and palpitations.  Gastrointestinal:  Negative for abdominal pain, constipation, diarrhea, nausea and vomiting.  Endocrine: Negative for polydipsia, polyphagia and polyuria.  Genitourinary:  Negative for difficulty urinating and dysuria.  Musculoskeletal:  Negative for arthralgias, back pain and myalgias.  Skin:  Negative for rash.  Neurological:  Positive for headaches.  Psychiatric/Behavioral:  Negative for dysphoric mood. The patient is not nervous/anxious.     Current Outpatient Medications on File Prior to Visit  Medication Sig Dispense Refill   diazepam  (VALIUM ) 5 MG tablet TAKE 1 TABLET BY MOUTH EVERY 12 HOURS AS NEEDED FOR MUSCLE SPASM (Patient taking differently: Take 5 mg by mouth daily as needed for anxiety.) 15 tablet 2   Eszopiclone  3 MG TABS Take 1 tablet (3 mg total) by mouth at bedtime. Take immediately before bedtime 30 tablet 5   ipratropium (  ATROVENT ) 0.06 % nasal spray Place 2 sprays into both nostrils 3 (three) times daily. 15 mL 2   Multiple Vitamin (MULTIVITAMIN) tablet Take 1 tablet by mouth daily.     promethazine  (PHENERGAN ) 25 MG tablet TAKE 1 TABLET BY MOUTH EVERY 6 HOURS AS NEEDED FOR NAUSEA AND VOMITING 30 tablet 3   venlafaxine  XR (EFFEXOR  XR) 37.5 MG 24 hr capsule Take 1 capsule (37.5 mg total) by mouth daily with breakfast. 90 capsule 0   No current facility-administered medications on file prior to visit.   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)    GERD (gastroesophageal reflux disease)    History of cervical cancer    Idiopathic hypotension    Insomnia due to other mental disorder    Lyme  disease    Major depressive disorder, recurrent, moderate (HCC)    Migraine without aura, not intractable, without status migrainosus    Other idiopathic scoliosis, lumbar region    Premenstrual tension syndrome    Small intestinal bacterial overgrowth    Xerosis cutis    Past Surgical History:  Procedure Laterality Date   ANAL FISSURE REPAIR  01/11/2019   BREAST ENHANCEMENT SURGERY  08/18/2009   CESAREAN SECTION  03/29/2023   CHOLECYSTECTOMY  2018   GANGLION CYST EXCISION     Left hand 2005   KNEE SURGERY Left 12/2017   SINOSCOPY  09/20/2011   TONSILLECTOMY  10/2007    Family History  Problem Relation Age of Onset   Diabetes Mother    Sjogren's syndrome Mother    High blood pressure Mother    Anxiety disorder Mother    Depression Mother    Diabetes Father    High blood pressure Maternal Grandmother    Heart Problems Maternal Grandmother    Heart attack Maternal Grandfather    COPD Other    Social History   Socioeconomic History   Marital status: Single    Spouse name: Not on file   Number of children: Not on file   Years of education: Not on file   Highest education level: Not on file  Occupational History   Occupation: book biomedical engineer  Tobacco Use   Smoking status: Former    Current packs/day: 0.00    Average packs/day: 1 pack/day for 10.0 years (10.0 ttl pk-yrs)    Types: Cigarettes    Start date: 2006    Quit date: 2016    Years since quitting: 9.9   Smokeless tobacco: Never  Vaping Use   Vaping status: Former   Devices: Advice Worker and Sexual Activity   Alcohol use: Yes    Comment: Seldom   Drug use: Never   Sexual activity: Yes    Partners: Male  Other Topics Concern   Not on file  Social History Narrative   Not on file   Social Drivers of Health   Financial Resource Strain: Low Risk  (12/25/2023)   Overall Financial Resource Strain (CARDIA)    Difficulty of Paying Living Expenses: Not hard at all  Food Insecurity: No Food Insecurity  (12/25/2023)   Hunger Vital Sign    Worried About Running Out of Food in the Last Year: Never true    Ran Out of Food in the Last Year: Never true  Transportation Needs: No Transportation Needs (12/25/2023)   PRAPARE - Administrator, Civil Service (Medical): No    Lack of Transportation (  Non-Medical): No  Physical Activity: Sufficiently Active (12/25/2023)   Exercise Vital Sign    Days of Exercise per Week: 7 days    Minutes of Exercise per Session: 30 min  Stress: No Stress Concern Present (12/25/2023)   [employer] of Occupational Health - Occupational Stress Questionnaire    Feeling of Stress : Not at all  Social Connections: Moderately Isolated (12/25/2023)   Social Connection and Isolation Panel    Frequency of Communication with Friends and Family: Three times a week    Frequency of Social Gatherings with Friends and Family: Three times a week    Attends Religious Services: Never    Active Member of Clubs or Organizations: No    Attends Banker Meetings: Never    Marital Status: Married    Objective:  BP 96/60   Pulse 88   Temp (!) 97.5 F (36.4 C)   Resp 16   Ht 5' 6 (1.676 m)   Wt 121 lb (54.9 kg)   LMP 09/02/2024 (Exact Date)   SpO2 98%   BMI 19.53 kg/m      09/03/2024    1:32 PM 06/02/2024    1:48 PM 02/05/2024    2:43 PM  BP/Weight  Systolic BP 96 130 108  Diastolic BP 60 70 78  Wt. (Lbs) 121 123 127  BMI 19.53 kg/m2 19.85 kg/m2 20.5 kg/m2    Physical Exam Vitals reviewed.  Constitutional:      Appearance: Normal appearance. She is normal weight.  Neck:     Vascular: No carotid bruit.  Cardiovascular:     Rate and Rhythm: Normal rate and regular rhythm.     Heart sounds: Normal heart sounds.  Pulmonary:     Effort: Pulmonary effort is normal. No respiratory distress.     Breath sounds: Normal breath sounds.  Abdominal:     General: Abdomen is flat. Bowel sounds are normal.     Palpations: Abdomen is soft.      Tenderness: There is no abdominal tenderness.  Neurological:     Mental Status: She is alert and oriented to person, place, and time.  Psychiatric:        Mood and Affect: Mood normal.        Behavior: Behavior normal.         Lab Results  Component Value Date   WBC 9.8 06/02/2024   HGB 14.3 06/02/2024   HCT 44.4 06/02/2024   PLT 292 06/02/2024   GLUCOSE 85 06/02/2024   CHOL 156 02/04/2021   TRIG 93 02/04/2021   HDL 84 02/04/2021   LDLCALC 55 02/04/2021   ALT 18 06/02/2024   AST 20 06/02/2024   NA 136 06/02/2024   K 4.5 06/02/2024   CL 101 06/02/2024   CREATININE 0.77 06/02/2024   BUN 17 06/02/2024   CO2 21 06/02/2024   TSH 0.792 06/02/2024    Results for orders placed or performed in visit on 09/03/24  POCT Lipid Panel   Collection Time: 09/03/24  2:31 PM  Result Value Ref Range   TC 132    HDL 47    TRG 95    LDL 66    Non-HDL 85    TC/HDL     LDL/HDL Ratio 1.4   .  Assessment & Plan:   Assessment & Plan Acute non-recurrent maxillary sinusitis Acute sinusitis with headache Sinus pain, pressure, postnasal drip, and facial headache suggest acute sinusitis. - Continue current management as symptoms are not severe. Orders:  amoxicillin -clavulanate (AUGMENTIN ) 875-125 MG tablet; Take 1 tablet by mouth 2 (two) times daily.  Pain of right hip Intermittent musculoskeletal pain with persistent right hip pain managed with stretches and yoga. - Continue daily stretches and yoga to maintain mobility and manage pain.    Myalgia Intermittent musculoskeletal pain with persistent right hip pain managed with stretches and yoga. - Continue daily stretches and yoga to maintain mobility and manage pain.    Primary insomnia Managed with Lunesta , which is effective. - Continue Lunesta  for sleep management.    GAD (generalized anxiety disorder) Managed with Effexor , expected to resume soon. Diazepam  used as needed for severe anxiety. - Resume Effexor  37.5 mg as  soon as available. - Use diazepam  as needed for severe anxiety.    Screening cholesterol level Check levels Orders:   POCT Lipid Panel  Body mass index is 19.53 kg/m.    Meds ordered this encounter  Medications   amoxicillin -clavulanate (AUGMENTIN ) 875-125 MG tablet    Sig: Take 1 tablet by mouth 2 (two) times daily.    Dispense:  20 tablet    Refill:  0    Orders Placed This Encounter  Procedures   POCT Lipid Panel    I,Marla I Leal-Borjas,acting as a scribe for Abigail Free, MD.,have documented all relevant documentation on the behalf of Abigail Free, MD,as directed by  Abigail Free, MD while in the presence of Abigail Free, MD.   Follow-up: Return in about 6 months (around 03/03/2025) for chronic follow up.  An After Visit Summary was printed and given to the patient.  I attest that I have reviewed this visit and agree with the plan scribed by my staff.   Abigail Free, MD [facility] [phone]

## 2024-09-03 ENCOUNTER — Encounter: Payer: Self-pay | Admitting: Family Medicine

## 2024-09-03 ENCOUNTER — Ambulatory Visit: Admitting: Family Medicine

## 2024-09-03 VITALS — BP 96/60 | HR 88 | Temp 97.5°F | Resp 16 | Ht 66.0 in | Wt 121.0 lb

## 2024-09-03 DIAGNOSIS — M791 Myalgia, unspecified site: Secondary | ICD-10-CM

## 2024-09-03 DIAGNOSIS — F411 Generalized anxiety disorder: Secondary | ICD-10-CM

## 2024-09-03 DIAGNOSIS — J0100 Acute maxillary sinusitis, unspecified: Secondary | ICD-10-CM

## 2024-09-03 DIAGNOSIS — F5101 Primary insomnia: Secondary | ICD-10-CM

## 2024-09-03 DIAGNOSIS — M25551 Pain in right hip: Secondary | ICD-10-CM

## 2024-09-03 DIAGNOSIS — Z13220 Encounter for screening for lipoid disorders: Secondary | ICD-10-CM

## 2024-09-03 LAB — POCT LIPID PANEL
HDL: 47
LDL/HDL Ratio: 1.4
LDL: 66
Non-HDL: 85
TC: 132
TRG: 95

## 2024-09-03 MED ORDER — AMOXICILLIN-POT CLAVULANATE 875-125 MG PO TABS
1.0000 | ORAL_TABLET | Freq: Two times a day (BID) | ORAL | 0 refills | Status: AC
Start: 2024-09-03 — End: ?

## 2024-09-03 NOTE — Patient Instructions (Signed)
  VISIT SUMMARY: Today, we addressed your sinus pain and pressure, musculoskeletal pain, insomnia, and anxiety. We also discussed general health maintenance, including cholesterol levels and vaccinations.  YOUR PLAN: ACUTE SINUSITIS WITH HEADACHE: You have sinus pain, pressure, postnasal drip, and facial headache, which suggest acute sinusitis. -Continue your current management as your symptoms are not severe. -Sent Augmentin   MUSCULOSKELETAL PAIN INCLUDING MYALGIA AND RIGHT HIP PAIN: You have intermittent muscle pain and persistent right hip pain, which you manage with stretches and yoga. -Continue your daily stretches and yoga to maintain mobility and manage pain.  INSOMNIA: Your insomnia is being managed effectively with Lunesta . -Continue taking Lunesta  for sleep management.  ANXIETY AND DEPRESSIVE DISORDERS: Your anxiety and depression are managed with Effexor  and diazepam  as needed. -Resume taking Effexor  37.5 mg as soon as it is available. -Use diazepam  as needed for severe anxiety.  GENERAL HEALTH MAINTENANCE: We discussed your cholesterol levels, HPV vaccination, and flu vaccination. -Your cholesterol levels were excellent in 2022. We performed a cholesterol screening with a finger stick today. -Discuss the HPV vaccination with your gynecologist during your upcoming colposcopy. -Consider getting a flu vaccination.                      Contains text generated by [employer].                                 Contains text generated by [employer].

## 2024-09-06 DIAGNOSIS — M791 Myalgia, unspecified site: Secondary | ICD-10-CM | POA: Insufficient documentation

## 2024-09-06 DIAGNOSIS — M25551 Pain in right hip: Secondary | ICD-10-CM | POA: Insufficient documentation

## 2024-09-06 NOTE — Assessment & Plan Note (Signed)
 Intermittent musculoskeletal pain with persistent right hip pain managed with stretches and yoga. - Continue daily stretches and yoga to maintain mobility and manage pain.

## 2024-09-06 NOTE — Assessment & Plan Note (Addendum)
 Intermittent musculoskeletal pain with persistent right hip pain managed with stretches and yoga. - Continue daily stretches and yoga to maintain mobility and manage pain.

## 2024-09-06 NOTE — Assessment & Plan Note (Signed)
 Managed with Effexor , expected to resume soon. Diazepam  used as needed for severe anxiety. - Resume Effexor  37.5 mg as soon as available. - Use diazepam  as needed for severe anxiety.

## 2024-09-06 NOTE — Assessment & Plan Note (Addendum)
 Managed with Lunesta , which is effective. - Continue Lunesta  for sleep management.

## 2024-09-06 NOTE — Assessment & Plan Note (Addendum)
 Acute sinusitis with headache Sinus pain, pressure, postnasal drip, and facial headache suggest acute sinusitis. - Continue current management as symptoms are not severe. Orders:   amoxicillin -clavulanate (AUGMENTIN ) 875-125 MG tablet; Take 1 tablet by mouth 2 (two) times daily.

## 2024-09-11 ENCOUNTER — Encounter: Payer: Self-pay | Admitting: Family Medicine

## 2024-09-12 MED ORDER — FLUCONAZOLE 150 MG PO TABS: 150.0000 mg | ORAL_TABLET | Freq: Once | ORAL | 1 refills | Status: AC

## 2025-03-04 ENCOUNTER — Ambulatory Visit: Admitting: Family Medicine
